# Patient Record
Sex: Male | Born: 1960 | Race: White | Hispanic: No | Marital: Married | Smoking: Never smoker
Health system: Southern US, Community
[De-identification: ages and names within clinical notes are randomized; demographics above are authoritative.]

## PROBLEM LIST (undated history)

## (undated) DIAGNOSIS — T7840XA Allergy, unspecified, initial encounter: Secondary | ICD-10-CM

## (undated) DIAGNOSIS — M1990 Unspecified osteoarthritis, unspecified site: Secondary | ICD-10-CM

## (undated) DIAGNOSIS — Z00129 Encounter for routine child health examination without abnormal findings: Secondary | ICD-10-CM

## (undated) HISTORY — DX: Allergy, unspecified, initial encounter: T78.40XA

## (undated) HISTORY — PX: TONSILLECTOMY: SUR1361

## (undated) HISTORY — PX: LIPOMA EXCISION: SHX5283

## (undated) HISTORY — DX: Unspecified osteoarthritis, unspecified site: M19.90

## (undated) HISTORY — PX: VASECTOMY: SHX75

## (undated) HISTORY — PX: PROSTATE BIOPSY: SHX241

## (undated) HISTORY — PX: APPENDECTOMY: SHX54

## (undated) HISTORY — PX: KNEE ARTHROSCOPY: SUR90

---
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
## 2011-05-25 HISTORY — PX: COLONOSCOPY: SHX174

## 2017-05-26 ENCOUNTER — Other Ambulatory Visit: Payer: Self-pay | Admitting: Urology

## 2017-05-26 DIAGNOSIS — R972 Elevated prostate specific antigen [PSA]: Secondary | ICD-10-CM

## 2017-07-01 ENCOUNTER — Other Ambulatory Visit: Payer: Self-pay

## 2018-06-28 ENCOUNTER — Ambulatory Visit (INDEPENDENT_AMBULATORY_CARE_PROVIDER_SITE_OTHER): Payer: Self-pay

## 2018-06-28 ENCOUNTER — Ambulatory Visit (INDEPENDENT_AMBULATORY_CARE_PROVIDER_SITE_OTHER): Payer: 59 | Admitting: Orthopaedic Surgery

## 2018-06-28 ENCOUNTER — Encounter (INDEPENDENT_AMBULATORY_CARE_PROVIDER_SITE_OTHER): Payer: Self-pay | Admitting: Orthopaedic Surgery

## 2018-06-28 ENCOUNTER — Ambulatory Visit (INDEPENDENT_AMBULATORY_CARE_PROVIDER_SITE_OTHER): Payer: 59

## 2018-06-28 VITALS — Ht 67.0 in | Wt 214.0 lb

## 2018-06-28 DIAGNOSIS — M25562 Pain in left knee: Secondary | ICD-10-CM

## 2018-06-28 DIAGNOSIS — M25561 Pain in right knee: Secondary | ICD-10-CM | POA: Diagnosis not present

## 2018-06-28 DIAGNOSIS — M170 Bilateral primary osteoarthritis of knee: Secondary | ICD-10-CM | POA: Diagnosis not present

## 2018-06-28 DIAGNOSIS — G8929 Other chronic pain: Secondary | ICD-10-CM | POA: Diagnosis not present

## 2018-06-28 DIAGNOSIS — M1711 Unilateral primary osteoarthritis, right knee: Secondary | ICD-10-CM | POA: Insufficient documentation

## 2018-06-28 DIAGNOSIS — M1712 Unilateral primary osteoarthritis, left knee: Secondary | ICD-10-CM | POA: Insufficient documentation

## 2018-06-28 NOTE — Progress Notes (Signed)
Office Visit Note   Patient: Bill Lee           Date of Birth: 1960-12-29           MRN: [id number] Visit Date: 06/28/2018              Requested by: No referring provider defined for this encounter. PCP: Fanny Bien, MD   Assessment & Plan: Visit Diagnoses:  1. Chronic pain of left knee   2. Right knee pain, unspecified chronicity   3. Unilateral primary osteoarthritis, left knee   4. Unilateral primary osteoarthritis, right knee     Plan: I went over his knee x-rays with him in significant detail.  We talked about the risk and benefits of knee replacement surgery.  That would be read my recommendation at this standpoint given the severity of his tricompartmental arthritis involving both knees.  I went over knee replacement models and explained about what his intraoperative and postoperative course would involve talking about this in significant detail.  All question concerns were answered and addressed.  He is going to continue to work on his mobility and strengthening and exercising and stretching his knees.  He will avoid high impact aerobic activities.  I gave him our surgery schedulers card to let us know when he would like to have the schedule.  I would be fine with doing both knees at once or having staged surgery.  Follow-Up Instructions: Return if symptoms worsen or fail to improve.   Orders:  Orders Placed This Encounter  Procedures  . XR Knee 1-2 Views Left  . XR Knee 1-2 Views Right   No orders of the defined types were placed in this encounter.     Procedures: No procedures performed   Clinical Data: No additional findings.   Subjective: Chief Complaint  Patient presents with  . Left Knee - Pain  . Right Knee - Pain  The patient is an incredibly pleasant 58 year old gentleman with bilateral knee pain and swelling for years now.  He has remote history of arthroscopic surgery on the left knee.  Both knees hurt on a daily basis and they have  detrimentally affecting his mobility, his quality of life and his activities of daily living.  He is a very active individual.  This is been going on for again well over 5 years to almost 10 years from when he had his knee arthroscopy on the left knee.  He is not a diabetic.  He is very active.  He has had injections in his knees as well.  He is tried and failed all forms conservative treatment at this point would consider knee replacement surgery.  He does live alone.  He is in a house that he does have 2 steps to get out into a kitchen.  HPI  Review of Systems He currently denies any headache, chest pain, shortness of breath, fever, chills, nausea, vomiting  Objective: Vital Signs: Ht 5\' 7"  (1.702 m)   Wt 214 lb (97.1 kg)   BMI 33.52 kg/m   Physical Exam He is alert and orient x3 and in no acute distress Ortho Exam Examination of both knees show he lacks full extension by just a few degrees.  Both knees have severe patellofemoral crepitation.  Both knees have varus malalignment.  Both knees have a significant pain with flexion extension.  Both knees are ligamentously stable. Specialty Comments:  No specialty comments available.  Imaging: Xr Knee 1-2 Views Left  Result Date: 06/28/2018  2 views the left knee show severe end-stage arthritis.  There is varus malalignment of the knee.  There is complete loss of medial joint space.  There are large particular osteophytes in all 3 compartments.  Xr Knee 1-2 Views Right  Result Date: 06/28/2018 2 views of the right knee show severe end-stage arthritis.  There is complete loss of the medial joint space.  There is varus malalignment.  There is large para-articular osteophytes in all 3 compartments.    PMFS History: Patient Active Problem List   Diagnosis Date Noted  . Unilateral primary osteoarthritis, left knee 06/28/2018  . Unilateral primary osteoarthritis, right knee 06/28/2018   History reviewed. No pertinent past medical history.    History reviewed. No pertinent family history.  History reviewed. No pertinent surgical history. Social History   Occupational History  . Not on file  Tobacco Use  . Smoking status: Not on file  Substance and Sexual Activity  . Alcohol use: Not on file  . Drug use: Not on file  . Sexual activity: Not on file

## 2020-01-30 ENCOUNTER — Ambulatory Visit: Payer: 59

## 2020-02-06 ENCOUNTER — Other Ambulatory Visit: Payer: Self-pay | Admitting: Urology

## 2020-02-06 DIAGNOSIS — R972 Elevated prostate specific antigen [PSA]: Secondary | ICD-10-CM

## 2020-02-27 ENCOUNTER — Other Ambulatory Visit: Payer: Self-pay

## 2020-02-27 ENCOUNTER — Ambulatory Visit: Payer: 59 | Attending: Family Medicine

## 2020-02-27 DIAGNOSIS — G8929 Other chronic pain: Secondary | ICD-10-CM | POA: Diagnosis present

## 2020-02-27 DIAGNOSIS — M25561 Pain in right knee: Secondary | ICD-10-CM | POA: Diagnosis present

## 2020-02-27 DIAGNOSIS — M6281 Muscle weakness (generalized): Secondary | ICD-10-CM

## 2020-02-27 DIAGNOSIS — M25562 Pain in left knee: Secondary | ICD-10-CM | POA: Insufficient documentation

## 2020-02-27 NOTE — Therapy (Signed)
[facility] [address] [place], [place], [address] Phone: [phone]   Fax:  [phone]  Physical Therapy Evaluation  Patient Details  Name: Bill Lee MRN: [id number] Date of Birth: [birth year] Referring Provider (PT): Rachell Cipro, MD   Encounter Date: 02/27/2020   PT End of Session - 02/27/20 1020    Visit Number 1    Date for PT Re-Evaluation 04/23/20    Authorization Type [employer]    PT Start Time 0932    PT Stop Time 1019    PT Time Calculation (min) 47 min    Activity Tolerance Patient tolerated treatment well    Behavior During Therapy [facility] for tasks assessed/performed           History reviewed. No pertinent past medical history.  History reviewed. No pertinent surgical history.  There were no vitals filed for this visit.    Subjective Assessment - 02/27/20 0934    Subjective Pt presents to PT with bil knee immobility and reduced function due to OA.  Pt has been told he needs TKA yet he is not having much pain.  Pt has done PT in the past and this has helped tremendously.  Pt has also had lubricating injections that have helped.  Pt had cortizone injections before a recent trip to [place].    Pertinent History bil knee OA    Limitations Standing;Walking    How long can you stand comfortably? 30 minutes    How long can you walk comfortably? unlevel ground: <1 mile, flat ground: 1 mile    Diagnostic tests x-ray: bil knee OA    Patient Stated Goals learn exercises to improve knee strength and mobility    Currently in Pain? Yes    Pain Score 0-No pain   3/10 with activity   Pain Location Knee    Pain Orientation Right;Left    Pain Descriptors / Indicators Discomfort    Pain Type Chronic pain    Pain Onset More than a month ago    Pain Frequency Intermittent    Aggravating Factors  standing/walking    Pain Relieving Factors sit, reduced activity, ice, stretch              [facility] PT  Assessment - 02/27/20 0001      Assessment   Medical Diagnosis OA knee pain, gait and strengthening    Referring Provider (PT) Rachell Cipro, MD    Onset Date/Surgical Date 02/27/08   Lt knee meniscus repair   Next MD Visit Noveber 2021    Prior Therapy 2010      Precautions   Precautions None      Restrictions   Weight Bearing Restrictions No      Balance Screen   Has the patient fallen in the past 6 months No    Has the patient had a decrease in activity level because of a fear of falling?  No    Is the patient reluctant to leave their home because of a fear of falling?  No      Home Environment   Living Environment Private residence    Living Arrangements Spouse/significant other    Type of [place] to enter    Home Layout Two level      Prior Function   Level of Independence Independent    Vocation Full time employment    Vocation Requirements desk work    Leisure cycling, walking  Cognition   Overall Cognitive Status Within Functional Limits for tasks assessed      Observation/Other Assessments   Focus on Therapeutic Outcomes (FOTO)  48% limitation      Posture/Postural Control   Posture/Postural Control Postural limitations    Posture Comments genu varus bil knees       ROM / Strength   AROM / PROM / Strength AROM;PROM;Strength      AROM   Overall AROM  Within functional limits for tasks performed    Overall AROM Comments full knee A/ROM, hamstring length reduced by 25%, Hip IR limited by 25%      PROM   Overall PROM  Deficits    Overall PROM Comments see above hip P/ROM      Strength   Overall Strength Deficits    Strength Assessment Site Knee;Hip    Right/Left Hip Right;Left    Right Hip Flexion 4+/5    Right Hip Extension 4+/5    Right Hip External Rotation  4/5    Right Hip ABduction 4+/5    Left Hip Flexion 4+/5    Left Hip Extension 4+/5    Left Hip External Rotation 4/5    Left Hip ABduction 4/5     Right/Left Knee Right;Left    Right Knee Flexion 5/5    Right Knee Extension 5/5    Left Knee Flexion 5/5    Left Knee Extension 4+/5      Palpation   Palpation comment no palpable tenderness       Transfers   Transfers Sit to Stand;Stand to Sit    Sit to Stand 7: Independent    Comments sit to stand with mild hip adduction      Ambulation/Gait   Ambulation/Gait Yes    Gait Pattern Step-through pattern;Lateral hip instability    Stairs Yes    Stairs Assistance 6: Modified independent (Device/Increase time)    Stair Management Technique Two rails;Alternating pattern    Gait Comments poor hip stability with descending steps Rt and Lt                       Objective measurements completed on examination: See above findings.               PT Education - 02/27/20 1015    Education Details Access Code: [email or web address]    Person(s) Educated Patient    Methods Explanation;Demonstration;Handout    Comprehension Verbalized understanding;Returned demonstration            PT Short Term Goals - 02/27/20 0933      PT SHORT TERM GOAL #1   Title be independent in initial HEP    Time 4    Period Weeks    Status New    Target Date 03/26/20      PT SHORT TERM GOAL #2   Title report 30% less bil knee  pain and fatigue with standing and walking    Time 4    Period Weeks    Status New    Target Date 03/26/20             PT Long Term Goals - 02/27/20 0933      PT LONG TERM GOAL #1   Title be indepedent in advanced HEP    Time 8    Period Weeks    Status New    Target Date 04/23/20      PT LONG TERM GOAL #2   Title reduce FOTO  to < or = to 33% limitation    Time 8    Period Weeks    Status New    Target Date 04/23/20      PT LONG TERM GOAL #3   Title demonstrate 5/5 bil hip abduction to improve hip stability with negotiating steps and gait    Time 8    Period Weeks    Status New    Target Date 04/23/20      PT LONG TERM GOAL #4   Title report  a 75% reduction in bil knee pain and fatigue with standing and walking    Time 8    Period Weeks    Status New    Target Date 04/23/20      PT LONG TERM GOAL #5   Title improve LE stability to descend steps with use of 1 rail and good eccentric control    Time 8    Period Weeks    Status New    Target Date 04/23/20                  Plan - 02/27/20 1028    Clinical Impression Statement Pt presents to PT with bil knee pain and OA of a chronic nature.  Pt had Lt menesectomy surgery in 2010 and has had problems since. Pt has had lubricating injections in the past and had a recent cortisone injection into bil knees recently before traveling to [place].  Pt reports 3/10 "discomfort" in bil knees with standing > 30 minutes and walking  to 1 mile and has associated LE fatigue with this.  Pt cycles regularly and does not have any limitation or pain associated with this.  Pt demonstrates genu varus bilaterally.  Pt demonstrates lateral hip instability with descending steps with reduced control on Rt and Lt.  Pt requires moderate UE support and demonstrates poor eccentric control.  Pt with decreased Rt and Lt hip abduction and strength.  Pt will benefit from skilled PT to address functional hip and knee instability, improve endurance and strength for functional tasks.    Personal Factors and Comorbidities Comorbidity 1    Comorbidities bil knee OA    Examination-Activity Limitations Stairs;Squat;Locomotion Level    Stability/Clinical Decision Making Stable/Uncomplicated    Clinical Decision Making Low    Rehab Potential Excellent    PT Frequency 1x / week    PT Duration 8 weeks    PT Treatment/Interventions ADLs/Self Care Home Management;Cryotherapy;Electrical Stimulation;Moist Heat;Gait training;Stair training;Functional mobility training;Therapeutic activities;Therapeutic exercise;Neuromuscular re-education;Manual techniques;Patient/family education;Passive range of motion;Taping    PT Next  Visit Plan Review HEP, add core, hip, knee and stability exercises    PT Home Exercise Plan Access Code: [email or web address]    Consulted and Agree with Plan of Care Patient           Patient will benefit from skilled therapeutic intervention in order to improve the following deficits and impairments:  Abnormal gait, Decreased activity tolerance, Decreased strength, Postural dysfunction, Decreased endurance, Decreased range of motion, Pain  Visit Diagnosis: Chronic pain of left knee - Plan: PT plan of care cert/re-cert  Chronic pain of right knee - Plan: PT plan of care cert/re-cert  Muscle weakness (generalized) - Plan: PT plan of care cert/re-cert     Problem List Patient Active Problem List   Diagnosis Date Noted  . Unilateral primary osteoarthritis, left knee 06/28/2018  . Unilateral primary osteoarthritis, right knee 06/28/2018    Sigurd Sos, PT 02/27/20 10:37 AM  [facility] [address] [place], [place], [address] Phone: [phone]   Fax:  [phone]  Name: Bill Lee MRN: [id number] Date of Birth: 04-04-1961

## 2020-02-27 NOTE — Patient Instructions (Signed)
Access Code: [email or web address] URL: [email or web address] Date: 02/27/2020 Prepared by: Claiborne Billings  Exercises Forward Step Down Touch with Heel - 2 x daily - 7 x weekly - 2 sets - 10 reps Clamshell - 2 x daily - 7 x weekly - 2 sets - 10 reps Supine Bridge with Resistance Band - 2 x daily - 7 x weekly - 2 sets - 10 reps - 5 hold Sit to Stand without Arm Support - 2 x daily - 7 x weekly - 2 sets - 10 reps

## 2020-03-03 ENCOUNTER — Other Ambulatory Visit: Payer: 59

## 2020-03-05 ENCOUNTER — Ambulatory Visit: Payer: 59

## 2020-03-05 ENCOUNTER — Other Ambulatory Visit: Payer: Self-pay

## 2020-03-05 DIAGNOSIS — M25561 Pain in right knee: Secondary | ICD-10-CM

## 2020-03-05 DIAGNOSIS — M25562 Pain in left knee: Secondary | ICD-10-CM | POA: Diagnosis not present

## 2020-03-05 DIAGNOSIS — M6281 Muscle weakness (generalized): Secondary | ICD-10-CM

## 2020-03-05 DIAGNOSIS — G8929 Other chronic pain: Secondary | ICD-10-CM

## 2020-03-05 NOTE — Therapy (Signed)
[facility] [address] [place], [place], [address] Phone: [phone]   Fax:  [phone]  Physical Therapy Treatment  Patient Details  Name: Bill Lee MRN: [id number] Date of Birth: 1961/03/04 Referring Provider (PT): Rachell Cipro, MD   Encounter Date: 03/05/2020   PT End of Session - 03/05/20 1013    Visit Number 2    Date for PT Re-Evaluation 04/23/20    Authorization Type Aet[employer]    PT Start Time [phone]    PT Stop Time 1013    PT Time Calculation (min) 39 min    Activity Tolerance Patient tolerated treatment well    Behavior During Therapy [facility] for tasks assessed/performed           History reviewed. No pertinent past medical history.  History reviewed. No pertinent surgical history.  There were no vitals filed for this visit.   Subjective Assessment - 03/05/20 0936    Subjective I have been doing my exercises about 50% of  the time.    Currently in Pain? Yes    Pain Score 0-No pain                             [facility] Adult PT Treatment/Exercise - 03/05/20 0001      Exercises   Exercises Knee/Hip;Lumbar      Lumbar Exercises: Seated   Sit to Stand 20 reps      Knee/Hip Exercises: Stretches   Active Hamstring Stretch Both;2 reps;30 seconds      Knee/Hip Exercises: Standing   Step Down Both;2 sets;5 sets;Step Height: 2"    Step Down Limitations grinding and reduced eccentric control.  Modified to SLS    SLS 10" hold x x5- cueing for level pelvis      Knee/Hip Exercises: Seated   Sit to Sand 2 sets;10 reps;without UE support      Knee/Hip Exercises: Sidelying   Hip ABduction Strengthening;Both;2 sets;10 reps    Hip ABduction Limitations max verbal cueing and tactile cues for alignment to reduce hip flexor substitution    Clams 2x10 bil                  PT Education - 03/05/20 1004    Education Details Access Code: [email or web address]    Person(s) Educated Patient     Methods Explanation;Demonstration;Handout    Comprehension Verbalized understanding;Returned demonstration            PT Short Term Goals - 02/27/20 0933      PT SHORT TERM GOAL #1   Title be independent in initial HEP    Time 4    Period Weeks    Status New    Target Date 03/26/20      PT SHORT TERM GOAL #2   Title report 30% less bil knee  pain and fatigue with standing and walking    Time 4    Period Weeks    Status New    Target Date 03/26/20             PT Long Term Goals - 02/27/20 0933      PT LONG TERM GOAL #1   Title be indepedent in advanced HEP    Time 8    Period Weeks    Status New    Target Date 04/23/20      PT LONG TERM GOAL #2   Title reduce FOTO to < or = to  33% limitation    Time 8    Period Weeks    Status New    Target Date 04/23/20      PT LONG TERM GOAL #3   Title demonstrate 5/5 bil hip abduction to improve hip stability with negotiating steps and gait    Time 8    Period Weeks    Status New    Target Date 04/23/20      PT LONG TERM GOAL #4   Title report a 75% reduction in bil knee pain and fatigue with standing and walking    Time 8    Period Weeks    Status New    Target Date 04/23/20      PT LONG TERM GOAL #5   Title improve LE stability to descend steps with use of 1 rail and good eccentric control    Time 8    Period Weeks    Status New    Target Date 04/23/20                 Plan - 03/05/20 0957    Clinical Impression Statement Pt with first time follow-up after evaluation.  Pt reports 50% compliance with HEP.  Pt provided verbal and tactile cues for gluteal activation and alignment with standing exercises.  PT reduced step height to 2" for step-down.  Pt had uncomfortable "grinding" from knees and PT further modified this to single leg stance with emphasis on soft knee and level pelvis.  PT provided consistent verbal and tactile cueing for core and gluteal activation.  Pt with chronic knee OA bilaterally and  will benefit from skilled PT for strength, flexibility and endurance.    PT Frequency 1x / week    PT Duration 8 weeks    PT Treatment/Interventions ADLs/Self Care Home Management;Cryotherapy;Electrical Stimulation;Moist Heat;Gait training;Stair training;Functional mobility training;Therapeutic activities;Therapeutic exercise;Neuromuscular re-education;Manual techniques;Patient/family education;Passive range of motion;Taping    PT Next Visit Plan add to HEP if pt is consistent with current HEP.  Core, hip, knee strength and stability.    PT Home Exercise Plan Access Code: [email or web address]    Recommended Other Services initial cert is signed    Consulted and Agree with Plan of Care Patient           Patient will benefit from skilled therapeutic intervention in order to improve the following deficits and impairments:  Abnormal gait, Decreased activity tolerance, Decreased strength, Postural dysfunction, Decreased endurance, Decreased range of motion, Pain  Visit Diagnosis: Chronic pain of left knee  Chronic pain of right knee  Muscle weakness (generalized)     Problem List Patient Active Problem List   Diagnosis Date Noted  . Unilateral primary osteoarthritis, left knee 06/28/2018  . Unilateral primary osteoarthritis, right knee 06/28/2018     Sigurd Sos, PT 03/05/20 10:14 AM  [facility] [address] [place], [place], [address] Phone: [phone]   Fax:  [phone]  Name: Matthias Bogus MRN: [id number] Date of Birth: 06/27/1960

## 2020-03-05 NOTE — Patient Instructions (Signed)
Access Code: [email or web address] URL: [email or web address] Date: 03/05/2020 Prepared by: Claiborne Billings  Exercises Single Leg Stance - 2 x daily - 7 x weekly - 1 sets - 5 reps - 10 hold

## 2020-03-07 ENCOUNTER — Other Ambulatory Visit: Payer: Self-pay

## 2020-03-12 ENCOUNTER — Other Ambulatory Visit: Payer: Self-pay

## 2020-03-12 ENCOUNTER — Encounter: Payer: Self-pay | Admitting: Physical Therapy

## 2020-03-12 ENCOUNTER — Ambulatory Visit (INDEPENDENT_AMBULATORY_CARE_PROVIDER_SITE_OTHER): Payer: 59 | Admitting: Gastroenterology

## 2020-03-12 ENCOUNTER — Ambulatory Visit: Payer: 59 | Admitting: Physical Therapy

## 2020-03-12 ENCOUNTER — Encounter: Payer: Self-pay | Admitting: Gastroenterology

## 2020-03-12 VITALS — BP 118/70 | HR 91 | Ht 67.0 in | Wt 210.0 lb

## 2020-03-12 DIAGNOSIS — K625 Hemorrhage of anus and rectum: Secondary | ICD-10-CM | POA: Diagnosis not present

## 2020-03-12 DIAGNOSIS — R1310 Dysphagia, unspecified: Secondary | ICD-10-CM | POA: Diagnosis not present

## 2020-03-12 DIAGNOSIS — G8929 Other chronic pain: Secondary | ICD-10-CM

## 2020-03-12 DIAGNOSIS — M6281 Muscle weakness (generalized): Secondary | ICD-10-CM

## 2020-03-12 DIAGNOSIS — M25561 Pain in right knee: Secondary | ICD-10-CM

## 2020-03-12 DIAGNOSIS — M25562 Pain in left knee: Secondary | ICD-10-CM | POA: Diagnosis not present

## 2020-03-12 MED ORDER — PLENVU 140 G PO SOLR: 140.0000 g | ORAL | 0 refills | Status: DC

## 2020-03-12 NOTE — Progress Notes (Signed)
Referring Provider: Fanny Bien, MD Primary Care Physician:  Fanny Bien, MD  Reason for Consultation:  Dysphagia, rectal bleeding   IMPRESSION:  Dysphagia to solids Rare Meloxicam use for arthritis Intermittent rectal bleeding Prior colonoscopy with Dr. Maurene Capes [facility]) 2013  Intermittent solid food dysphagia: Etiology unclear.  No alarm symptoms.  By history, sounds esophageal. Trial of pantoprazole 40 mg QAM recommended but the patient declined. EGD with esophageal and gastric biopsies recommended. Will readdress therapeutic options after endoscopy.   Intermittent rectal bleeding: The differential for rectal bleeding is broad.  It includes outlet sources such as fissure or hemorrhoids, as well as polyps, mass, ulcers, and colitis.  Given this differential I am recommending a colonoscopy.  PLAN: EGD High fiber diet recommended, drink at least 1.5-2 liters of water Daily stool bulking agent with psyllium or methylcellulose recommended Colonoscopy Obtain prior colonoscopy reports from [facility]  Please see the "Patient Instructions" section for addition details about the plan.  HPI: Bill Lee is a 59 y.o. male referred by Dr. Ernie Hew for dysphagia.  The history is obtained through the patient.  He has arthritis managed with meloxicam and physical therapy.  Seen today for intermittent dysphagia to solids for 2 years. Localized to the sternal notch. Sensation that food is stuck, particularly when eating dry foods. Improved if he tucks his chin when he swallows.   Infrequent nausea or vomiting.  No heartburn, odynophagia, dysphonia, cough, neck pain, sore throat, abdominal pain, change in bowel habits. Dr. Ernie Hew suggested that it was reflux, but, because he doesn't have heartburn so he wasn't sure this was the etiology. His wife didn't want him to start heartburn medicine that he may have to be on the indefinately. He would prefer a full evaluation and an answer as opposed to  empiric therapy.   Reports intermittent rectal bleeding on the toilet paper without constipation, diarrhea, change in bowel habits, or straining. No rectal pain.  Uses Metamucil daily with control of daily bowel habits.  Has at least 1 formed bowel movement daily.  Uses meloxicam for joint pains.   No prior abdominal imaging.  Had a colonoscopy in 2013 with Dr. Maurene Capes in [place], [place].  No known family history of colon cancer or polyps. No family history of uterine/endometrial cancer, pancreatic cancer or gastric/stomach cancer.   Past Medical History:  Diagnosis Date  . Healthy male adolescent     Past Surgical History:  Procedure Laterality Date  . APPENDECTOMY    . COLONOSCOPY  2013  . KNEE ARTHROSCOPY Left   . LIPOMA EXCISION    . PROSTATE BIOPSY    . TONSILLECTOMY    . VASECTOMY      Current Outpatient Medications  Medication Sig Dispense Refill  . DULoxetine (CYMBALTA) 20 MG capsule Take 20 mg by mouth daily.    . meloxicam (MOBIC) 7.5 MG tablet Take 7.5 mg by mouth as needed.     . psyllium (REGULOID) 0.52 g capsule Take 0.52 g by mouth daily. Takes 1-2 tablets qd but may take up to 4 tablets qd    . PEG-KCl-NaCl-NaSulf-Na Asc-C (PLENVU) 140 g SOLR Take 140 g by mouth as directed. Manufacturer's coupon Universal coupon code:BIN: P2366821; GROUP: [id number]; PCN: [id number]; ID: [id number]; PAY NO MORE $50 1 each 0   No current facility-administered medications for this visit.    Allergies as of 03/12/2020 - Review Complete 03/12/2020  Allergen Reaction Noted  . Penicillins  06/28/2018    Family History  Problem  Relation Age of Onset  . Stroke Mother   . Alcoholism Father   . Healthy Sister   . Dysphagia Maternal Grandmother   . Other Paternal Grandfather        pacemaker  . Colon cancer Neg Hx   . Stomach cancer Neg Hx   . Liver disease Neg Hx   . Esophageal cancer Neg Hx   . Pancreatic cancer Neg Hx     Social History   Socioeconomic History  . Marital status:  Single    Spouse name: Not on file  . Number of children: Not on file  . Years of education: Not on file  . Highest education level: Not on file  Occupational History  . Not on file  Tobacco Use  . Smoking status: Never Smoker  . Smokeless tobacco: Never Used  Vaping Use  . Vaping Use: Never used  Substance and Sexual Activity  . Alcohol use: Yes    Alcohol/week: 2.0 - 3.0 standard drinks    Types: 2 - 3 Cans of beer per week  . Drug use: Never  . Sexual activity: Yes    Partners: Female    Birth control/protection: None    Comment: married  Other Topics Concern  . Not on file  Social History Narrative  . Not on file   Social Determinants of Health   Financial Resource Strain:   . Difficulty of Paying Living Expenses: Not on file  Food Insecurity:   . Worried About Charity fundraiser in the Last Year: Not on file  . Ran Out of Food in the Last Year: Not on file  Transportation Needs:   . Lack of Transportation (Medical): Not on file  . Lack of Transportation (Non-Medical): Not on file  Physical Activity:   . Days of Exercise per Week: Not on file  . Minutes of Exercise per Session: Not on file  Stress:   . Feeling of Stress : Not on file  Social Connections:   . Frequency of Communication with Friends and Family: Not on file  . Frequency of Social Gatherings with Friends and Family: Not on file  . Attends Religious Services: Not on file  . Active Member of Clubs or Organizations: Not on file  . Attends Archivist Meetings: Not on file  . Marital Status: Not on file  Intimate Partner Violence:   . Fear of Current or Ex-Partner: Not on file  . Emotionally Abused: Not on file  . Physically Abused: Not on file  . Sexually Abused: Not on file    Review of Systems: 12 system ROS is negative except as noted above with the addition of arthritis, back pain, confusion.   Physical Exam: General:   Alert,  well-nourished, pleasant and cooperative in  NAD Head:  Normocephalic and atraumatic. Eyes:  Sclera clear, no icterus.   Conjunctiva pink. Ears:  Normal auditory acuity. Nose:  No deformity, discharge,  or lesions. Mouth:  No deformity or lesions.   Neck:  Supple; no masses or thyromegaly. Lungs:  Clear throughout to auscultation.   No wheezes. Heart:  Regular rate and rhythm; no murmurs. Abdomen:  Soft, nontender, nondistended, normal bowel sounds, no rebound or guarding. No hepatosplenomegaly.   Rectal:  Deferred  Msk:  Symmetrical. No boney deformities LAD: No inguinal or umbilical LAD Extremities:  No clubbing or edema. Neurologic:  Alert and  oriented x4;  grossly nonfocal Skin:  Intact without significant lesions or rashes. Psych:  Alert and  cooperative. Normal mood and affect.    Anadelia Kintz L. Tarri Glenn, MD, MPH 03/12/2020, 9:52 AM

## 2020-03-12 NOTE — Patient Instructions (Addendum)
I have recommended an EGD and a colonoscopy.   Tips for colonoscopy:  - Stay well hydrated for 3-4 days prior to the exam. This reduces nausea and dehydration.  - To prevent skin/hemorrhoid irritation - prior to wiping, put A&Dointment or vaseline on the toilet paper. - Keep a towel or pad on the bed.  - Drink  64oz of clear liquids in the morning of prep day (prior to starting the prep) to be sure that there is enough fluid to flush the colon and stay hydrated!!!! This is in addition to the fluids required for preparation. - Use of a flavored hard candy, such as grape Anise Salvo, can counteract some of the flavor of the prep and may prevent some nausea.   If you are age 33 or older, your body mass index should be between 23-30. Your Body mass index is 32.89 kg/m. If this is out of the aforementioned range listed, please consider follow up with your Primary Care Provider.  If you are age 72 or younger, your body mass index should be between 19-25. Your Body mass index is 32.89 kg/m. If this is out of the aformentioned range listed, please consider follow up with your Primary Care Provider.   You have been scheduled for an endoscopy and colonoscopy. Please follow the written instructions given to you at your visit today. Please pick up your prep supplies at the pharmacy within the next 1-3 days. If you use inhalers (even only as needed), please bring them with you on the day of your procedure.  Take a daily stool bulking agent with psyllium or methylcellulose  Thank you for trusting me with your gastrointestinal care!    Thornton Park, MD, MPH

## 2020-03-12 NOTE — Therapy (Signed)
[facility] [address] [place], [place], [address] Phone: [phone]   Fax:  [phone]  Physical Therapy Treatment  Patient Details  Name: Bill Lee MRN: [id number] Date of Birth: 08-14-1960 Referring Provider (PT): Rachell Cipro, MD   Encounter Date: 03/12/2020   PT End of Session - 03/12/20 1229    Visit Number 3    Date for PT Re-Evaluation 04/23/20    Authorization Type Aetna    PT Start Time 1224    PT Stop Time 1310    PT Time Calculation (min) 46 min    Activity Tolerance Patient tolerated treatment well    Behavior During Therapy [facility] for tasks assessed/performed           Past Medical History:  Diagnosis Date  . Healthy male adolescent     Past Surgical History:  Procedure Laterality Date  . APPENDECTOMY    . COLONOSCOPY  2013  . KNEE ARTHROSCOPY Left   . LIPOMA EXCISION    . PROSTATE BIOPSY    . TONSILLECTOMY    . VASECTOMY      There were no vitals filed for this visit.   Subjective Assessment - 03/12/20 1230    Subjective Doing well. Riding my bike and walking on my own. i don't always get to my hip exercises.    Pertinent History bil knee OA    Currently in Pain? No/denies    Multiple Pain Sites No                             [facility] Adult PT Treatment/Exercise - 03/12/20 0001      Knee/Hip Exercises: Aerobic   Stationary Bike L2-3 x 10 min      Knee/Hip Exercises: Machines for Strengthening   Total Gym Leg Press Seat 6: Bil 120# 10x 2 : RTLE 60# 10x      Knee/Hip Exercises: Standing   SLS 10" hold x x5- cueing for level pelvis   VC to unlock knee,    Other Standing Knee Exercises Woodpecker exercise 10x Bil: PTA demo instruction, pt good demo.       Knee/Hip Exercises: Sidelying   Hip ABduction Strengthening;Both;1 set;10 reps    Hip ABduction Limitations TC to get into more hip extension   yellow band added for second set: will do at home    Clams 2x10 bil                    PT Short Term Goals - 02/27/20 0933      PT SHORT TERM GOAL #1   Title be independent in initial HEP    Time 4    Period Weeks    Status New    Target Date 03/26/20      PT SHORT TERM GOAL #2   Title report 30% less bil knee  pain and fatigue with standing and walking    Time 4    Period Weeks    Status New    Target Date 03/26/20             PT Long Term Goals - 02/27/20 0933      PT LONG TERM GOAL #1   Title be indepedent in advanced HEP    Time 8    Period Weeks    Status New    Target Date 04/23/20      PT LONG TERM GOAL #2  Title reduce FOTO to < or = to 33% limitation    Time 8    Period Weeks    Status New    Target Date 04/23/20      PT LONG TERM GOAL #3   Title demonstrate 5/5 bil hip abduction to improve hip stability with negotiating steps and gait    Time 8    Period Weeks    Status New    Target Date 04/23/20      PT LONG TERM GOAL #4   Title report a 75% reduction in bil knee pain and fatigue with standing and walking    Time 8    Period Weeks    Status New    Target Date 04/23/20      PT LONG TERM GOAL #5   Title improve LE stability to descend steps with use of 1 rail and good eccentric control    Time 8    Period Weeks    Status New    Target Date 04/23/20                 Plan - 03/12/20 1229    Clinical Impression Statement Pt compliant with most HEP, he reports he tends to miss his "hip exercises." Pt demonstrates Bil hip instability seemingly > on the LT. Yellow band was given to advance hip clam shells and hip abduction.    Personal Factors and Comorbidities Comorbidity 1    Comorbidities bil knee OA    Examination-Activity Limitations Stairs;Squat;Locomotion Level    Stability/Clinical Decision Making Stable/Uncomplicated    Rehab Potential Excellent    PT Frequency 1x / week    PT Duration 8 weeks    PT Treatment/Interventions ADLs/Self Care Home  Management;Cryotherapy;Electrical Stimulation;Moist Heat;Gait training;Stair training;Functional mobility training;Therapeutic activities;Therapeutic exercise;Neuromuscular re-education;Manual techniques;Patient/family education;Passive range of motion;Taping    PT Next Visit Plan add to HEP if pt is consistent with current HEP.  Core, hip, knee strength and stability.    PT Home Exercise Plan Access Code: [email or web address]    Consulted and Agree with Plan of Care Patient           Patient will benefit from skilled therapeutic intervention in order to improve the following deficits and impairments:  Abnormal gait, Decreased activity tolerance, Decreased strength, Postural dysfunction, Decreased endurance, Decreased range of motion, Pain  Visit Diagnosis: Chronic pain of left knee  Chronic pain of right knee  Muscle weakness (generalized)     Problem List Patient Active Problem List   Diagnosis Date Noted  . Unilateral primary osteoarthritis, left knee 06/28/2018  . Unilateral primary osteoarthritis, right knee 06/28/2018    Bill Lee, PTA 03/12/2020, 1:13 PM  [facility] [address], [place], [place], [address] Phone: [phone]   Fax:  [phone]  Name: Bill Lee MRN: [id number] Date of Birth: 04/20/1961

## 2020-03-19 ENCOUNTER — Encounter: Payer: Self-pay | Admitting: Physical Therapy

## 2020-03-19 ENCOUNTER — Ambulatory Visit: Payer: 59 | Admitting: Physical Therapy

## 2020-03-19 ENCOUNTER — Other Ambulatory Visit: Payer: Self-pay

## 2020-03-19 DIAGNOSIS — M6281 Muscle weakness (generalized): Secondary | ICD-10-CM

## 2020-03-19 DIAGNOSIS — M25562 Pain in left knee: Secondary | ICD-10-CM

## 2020-03-19 DIAGNOSIS — G8929 Other chronic pain: Secondary | ICD-10-CM

## 2020-03-19 NOTE — Therapy (Addendum)
[facility] [address] [place], [place], [address] Phone: [phone]   Fax:  [phone]  Physical Therapy Treatment  Patient Details  Name: Bill Lee MRN: [id number] Date of Birth: Dec 16, 1960 Referring Provider (PT): Maryelizabeth Rowan, MD   Encounter Date: 03/19/2020   PT End of Session - 03/19/20 0932    Visit Number 4    Date for PT Re-Evaluation 04/23/20    Authorization Type Aetna    PT Start Time 0930    PT Stop Time 1010    PT Time Calculation (min) 40 min    Activity Tolerance Patient tolerated treatment well    Behavior During Therapy [facility] for tasks assessed/performed           History reviewed. No pertinent past medical history.  Past Surgical History:  Procedure Laterality Date  . APPENDECTOMY    . COLONOSCOPY  2013  . KNEE ARTHROSCOPY Left   . LIPOMA EXCISION    . PROSTATE BIOPSY    . TONSILLECTOMY    . VASECTOMY      There were no vitals filed for this visit.   Subjective Assessment - 03/19/20 0934    Subjective Man my hips are weak.    Pertinent History bil knee OA    Currently in Pain? No/denies    Multiple Pain Sites No              [facility] PT Assessment - 03/19/20 0001      Observation/Other Assessments   Focus on Therapeutic Outcomes (FOTO)  46% limitation      Strength   Strength Assessment Site Hip;Knee    Right/Left Hip Right;Left    Right Hip Flexion 4+/5    Right Hip Extension 4+/5    Right Hip ABduction 4+/5    Left Hip Flexion 4+/5    Left Hip Extension 4+/5    Left Hip ABduction 4+/5    Right Knee Flexion 5/5    Right Knee Extension 5/5    Left Knee Flexion 5/5    Left Knee Extension 5/5                         [facility] Adult PT Treatment/Exercise - 03/19/20 0001      Knee/Hip Exercises: Aerobic   Stationary Bike L4 x 10 min to end session Pt can maintain > 50 rpms       Knee/Hip Exercises: Machines for Strengthening   Total Gym Leg  Press Seat 6 Bil 120# 10x, 150# 10x,  BilLE 60# 10x, 70# 10x 80# on Lt for second set      Knee/Hip Exercises: Standing   Other Standing Knee Exercises Woodpecker exercise 10x Bil: PTA demo instruction, pt good demo.    VC to facilitate later hip >     Knee/Hip Exercises: Sidelying   Other Sidelying Knee/Hip Exercises PTA check in with  activation of  glute medius'; pt could demo with good activation                     PT Short Term Goals - 02/27/20 0933      PT SHORT TERM GOAL #1   Title be independent in initial HEP    Time 4    Period Weeks    Status New    Target Date 03/26/20      PT SHORT TERM GOAL #2   Title report 30% less bil knee  pain and fatigue  with standing and walking    Time 4    Period Weeks    Status New    Target Date 03/26/20             PT Long Term Goals - 03/19/20 1008      PT LONG TERM GOAL #1   Title be indepedent in advanced HEP    Time 8    Period Weeks    Status Achieved      PT LONG TERM GOAL #2   Title reduce FOTO to < or = to 33% limitation    Time 8    Period Weeks    Status Not Met   46%     PT LONG TERM GOAL #3   Title demonstrate 5/5 bil hip abduction to improve hip stability with negotiating steps and gait    Time 8    Period Weeks    Status Not Met   Stil 4+/5     PT LONG TERM GOAL #4   Title report a 75% reduction in bil knee pain and fatigue with standing and walking    Time 8    Period Weeks    Status Achieved      PT LONG TERM GOAL #5   Title improve LE stability to descend steps with use of 1 rail and good eccentric control    Time 8    Period Weeks    Status Not Met                 Plan - 03/19/20 0932    Clinical Impression Statement Pt arrives with pain free knee report and continued comploiance with HEP. Pt has worked very hard this past week on facilittaing his glute medius more efficiently which he is. pt tolerated increases in load on the leg press and is beginning to show some  improvemnt in unlockin ghis knee more during standing hip stabiliazation exercises. Hip abductors not "stronger" via MMT but he definitely can activate better, so LTG was not met.    Personal Factors and Comorbidities Comorbidity 1    Comorbidities bil knee OA    Examination-Activity Limitations Stairs;Squat;Locomotion Level    Stability/Clinical Decision Making Stable/Uncomplicated    Rehab Potential Excellent    PT Frequency 1x / week    PT Duration 8 weeks    PT Treatment/Interventions ADLs/Self Care Home Management;Cryotherapy;Electrical Stimulation;Moist Heat;Gait training;Stair training;Functional mobility training;Therapeutic activities;Therapeutic exercise;Neuromuscular re-education;Manual techniques;Patient/family education;Passive range of motion;Taping    PT Next Visit Plan DC to HEP    PT Home Exercise Plan Access Code: [email or web address]    Consulted and Agree with Plan of Care Patient           Patient will benefit from skilled therapeutic intervention in order to improve the following deficits and impairments:  Abnormal gait, Decreased activity tolerance, Decreased strength, Postural dysfunction, Decreased endurance, Decreased range of motion, Pain  Visit Diagnosis: Chronic pain of left knee  Chronic pain of right knee  Muscle weakness (generalized)     Problem List Patient Active Problem List   Diagnosis Date Noted  . Unilateral primary osteoarthritis, left knee 06/28/2018  . Unilateral primary osteoarthritis, right knee 06/28/2018    Myrene Galas, PTA 03/19/20 10:54 AM  03/19/2020, 10:52 AM PHYSICAL THERAPY DISCHARGE SUMMARY  Visits from Start of Care: 4  Current functional level related to goals / functional outcomes: See above for current status.  Pt will continue with HEP.    Remaining deficits: Functional LE weakness.  Pt with improved activation and cocontraction and will continue with HEP to continue to improve strength.     Education /  Equipment: HEP Plan: Patient agrees to discharge.  Patient goals were partially met. Patient is being discharged due to being pleased with the current functional level.  ?????        Sigurd Sos, PT 03/19/20 11:46 AM   [facility] [address], [place], [place], [address] Phone: [phone]   Fax:  [phone]  Name: Bill Lee MRN: [id number] Date of Birth: 11-24-1960

## 2020-03-21 ENCOUNTER — Ambulatory Visit
Admission: RE | Admit: 2020-03-21 | Discharge: 2020-03-21 | Disposition: A | Discharge status: Home / Self Care | Payer: 59 | Source: Ambulatory Visit | Attending: Urology | Admitting: Urology

## 2020-03-21 ENCOUNTER — Other Ambulatory Visit: Payer: Self-pay

## 2020-03-21 DIAGNOSIS — R972 Elevated prostate specific antigen [PSA]: Secondary | ICD-10-CM

## 2020-03-21 MED ORDER — GADOBENATE DIMEGLUMINE 529 MG/ML IV SOLN
20.0000 mL | Freq: Once | INTRAVENOUS | Status: AC | PRN
  Administered 2020-03-21: 20 mL via INTRAVENOUS

## 2020-05-05 ENCOUNTER — Ambulatory Visit (AMBULATORY_SURGERY_CENTER): Payer: 59 | Admitting: Gastroenterology

## 2020-05-05 ENCOUNTER — Other Ambulatory Visit: Payer: Self-pay

## 2020-05-05 ENCOUNTER — Encounter: Payer: Self-pay | Admitting: Gastroenterology

## 2020-05-05 VITALS — BP 103/57 | HR 71 | Temp 96.4°F | Resp 18 | Ht 67.0 in | Wt 210.0 lb

## 2020-05-05 DIAGNOSIS — D120 Benign neoplasm of cecum: Secondary | ICD-10-CM

## 2020-05-05 DIAGNOSIS — K2100 Gastro-esophageal reflux disease with esophagitis, without bleeding: Secondary | ICD-10-CM

## 2020-05-05 DIAGNOSIS — R1310 Dysphagia, unspecified: Secondary | ICD-10-CM | POA: Diagnosis present

## 2020-05-05 DIAGNOSIS — K319 Disease of stomach and duodenum, unspecified: Secondary | ICD-10-CM

## 2020-05-05 DIAGNOSIS — K648 Other hemorrhoids: Secondary | ICD-10-CM

## 2020-05-05 DIAGNOSIS — K3189 Other diseases of stomach and duodenum: Secondary | ICD-10-CM | POA: Diagnosis not present

## 2020-05-05 DIAGNOSIS — K625 Hemorrhage of anus and rectum: Secondary | ICD-10-CM | POA: Diagnosis not present

## 2020-05-05 MED ORDER — SODIUM CHLORIDE 0.9 % IV SOLN
500.0000 mL | Freq: Once | INTRAVENOUS | Status: DC
Start: 2020-05-05 — End: 2020-05-05

## 2020-05-05 MED ORDER — PANTOPRAZOLE SODIUM 40 MG PO TBEC: 40.0000 mg | DELAYED_RELEASE_TABLET | Freq: Two times a day (BID) | ORAL | 2 refills | Status: DC

## 2020-05-05 NOTE — Progress Notes (Signed)
Called to room to assist during endoscopic procedure.  Patient ID and intended procedure confirmed with present staff. Received instructions for my participation in the procedure from the performing physician.  

## 2020-05-05 NOTE — Patient Instructions (Signed)
Handouts on gastritis,esophagitis given to you today  Await pathology results on biopsies done   NO ASPIRIN, ASPIRIN CONTAINING PRODUCTS (BC OR GOODY POWDERS) OR NSAIDS (IBUPROFEN, ADVIL, ALEVE, AND MOTRIN) INCLUDING MELOXICAM .; TYLENOL IS OK TO TAKE  START PANTOPRAZOLE 40 MG BY MOUTH TWICE A DAY FOR 8 WEEKS - SENT TO YOUR PHARMACY   HANDOUTS ON POLYPS AND HEMORRHOIDS & HIGH FIBER DIET  GIVEN TO YOU TODAY  FOLLOW HIGH FIBER DIET   ADD PSYLLIUM OR METHYL CELLULOSE SUCH AS METAMUCIL   AWAIT PATHOLOGY RESULTS ON POLYP REMOVED     YOU HAD AN ENDOSCOPIC PROCEDURE TODAY AT [place]:   Refer to the procedure report that was given to you for any specific questions about what was found during the examination.  If the procedure report does not answer your questions, please call your gastroenterologist to clarify.  If you requested that your care partner not be given the details of your procedure findings, then the procedure report has been included in a sealed envelope for you to review at your convenience later.  YOU SHOULD EXPECT: Some feelings of bloating in the abdomen. Passage of more gas than usual.  Walking can help get rid of the air that was put into your GI tract during the procedure and reduce the bloating. If you had a lower endoscopy (such as a colonoscopy or flexible sigmoidoscopy) you may notice spotting of blood in your stool or on the toilet paper. If you underwent a bowel prep for your procedure, you may not have a normal bowel movement for a few days.  Please Note:  You might notice some irritation and congestion in your nose or some drainage.  This is from the oxygen used during your procedure.  There is no need for concern and it should clear up in a day or so.  SYMPTOMS TO REPORT IMMEDIATELY:   Following lower endoscopy (colonoscopy or flexible sigmoidoscopy):  Excessive amounts of blood in the stool  Significant tenderness or worsening of abdominal  pains  Swelling of the abdomen that is new, acute  Fever of 100F or higher   Following upper endoscopy (EGD)  Vomiting of blood or coffee ground material  New chest pain or pain under the shoulder blades  Painful or persistently difficult swallowing  New shortness of breath  Fever of 100F or higher  Black, tarry-looking stools  For urgent or emergent issues, a gastroenterologist can be reached at any hour by calling [phone]. Do not use MyChart messaging for urgent concerns.    DIET:  We do recommend a small meal at first, but then you may proceed to your regular diet.  Drink plenty of fluids but you should avoid alcoholic beverages for 24 hours.  ACTIVITY:  You should plan to take it easy for the rest of today and you should NOT DRIVE or use heavy machinery until tomorrow (because of the sedation medicines used during the test).    FOLLOW UP: Our staff will call the number listed on your records 48-72 hours following your procedure to check on you and address any questions or concerns that you may have regarding the information given to you following your procedure. If we do not reach you, we will leave a message.  We will attempt to reach you two times.  During this call, we will ask if you have developed any symptoms of COVID 19. If you develop any symptoms (ie: fever, flu-like symptoms, shortness of breath, cough  etc.) before then, please call [phone].  If you test positive for Covid 19 in the 2 weeks post procedure, please call and report this information to [place].    If any biopsies were taken you will be contacted by phone or by letter within the next 1-3 weeks.  Please call us at [phone] if you have not heard about the biopsies in 3 weeks.    SIGNATURES/CONFIDENTIALITY: You and/or your care partner have signed paperwork which will be entered into your electronic medical record.  These signatures attest to the fact that that the information above on your After  Visit Summary has been reviewed and is understood.  Full responsibility of the confidentiality of this discharge information lies with you and/or your care-partner.

## 2020-05-05 NOTE — Progress Notes (Signed)
1458 HR dropped to 39 to 49 with BP dropping 75/50.  Robinul 0.2 mg IV given and ephedrine 10 mg given.  VSS returned to baseline.

## 2020-05-05 NOTE — Progress Notes (Signed)
Report given to PACU, vss 

## 2020-05-05 NOTE — Progress Notes (Signed)
Medical history reviewed with no changes noted. VS assessed by J.D 

## 2020-05-05 NOTE — Op Note (Addendum)
[place] Patient Name: Bill Lee Procedure Date: 05/05/2020 2:42 PM MRN: [id number] Endoscopist: Thornton Park MD, MD Age: 59 Referring MD:  Date of Birth: 06-15-1960 Gender: Male Account #: [id number] Procedure:                Colonoscopy Indications:              Intermittent rectal bleeding                           Prior colonoscopy with Dr. Maurene Capes [facility]) 2013 Medicines:                Monitored Anesthesia Care Procedure:                Pre-Anesthesia Assessment:                           - Prior to the procedure, a History and Physical                            was performed, and patient medications and                            allergies were reviewed. The patient's tolerance of                            previous anesthesia was also reviewed. The risks                            and benefits of the procedure and the sedation                            options and risks were discussed with the patient.                            All questions were answered, and informed consent                            was obtained. Prior Anticoagulants: The patient has                            taken no previous anticoagulant or antiplatelet                            agents. ASA Grade Assessment: I - A normal, healthy                            patient. After reviewing the risks and benefits,                            the patient was deemed in satisfactory condition to                            undergo the procedure.  After obtaining informed consent, the colonoscope                            was passed under direct vision. Throughout the                            procedure, the patient's blood pressure, pulse, and                            oxygen saturations were monitored continuously. The                            Colonoscope was introduced through the anus and                            advanced to the 3 cm into the ileum. A second                             forward view of the right colon was performed. The                            colonoscopy was performed without difficulty. The                            patient tolerated the procedure fairly well. The                            quality of the bowel preparation was good. The                            terminal ileum, ileocecal valve, appendiceal                            orifice, and rectum were photographed. Scope In: 2:57:06 PM Scope Out: 3:12:06 PM Scope Withdrawal Time: 0 hours 10 minutes 47 seconds  Total Procedure Duration: 0 hours 15 minutes 0 seconds  Findings:                 Non-bleeding external and internal hemorrhoids were                            found.                           A less than 1 mm possible polyp was found in the                            cecum. The polyp was flat. The polyp was removed                            with a cold biopsy forceps. Resection and retrieval                            were complete. Estimated blood loss  was minimal.                           The exam was otherwise without abnormality on                            direct and retroflexion views. Complications:            Temporary bradycardia that resolved with Robinol.                            Estimated blood loss: Minimal. Estimated Blood Loss:     Estimated blood loss was minimal. Impression:               - Non-bleeding external and internal hemorrhoids.                            The likely cause of rectal bleeding.                           - One less than 1 mm possible polyp in the cecum,                            removed with a cold biopsy forceps. Resected and                            retrieved.                           - The examination was otherwise normal on direct                            and retroflexion views. Recommendation:           - Patient has a contact number available for                            emergencies. The signs and  symptoms of potential                            delayed complications were discussed with the                            patient. Return to normal activities tomorrow.                            Written discharge instructions were provided to the                            patient.                           - Resume previous diet. High fiber diet recommended.                           - Continue present medications. Add a daily stool  bulking agent such as psyllium or methylcellulose.                           - Await pathology results.                           - Repeat colonoscopy date to be determined after                            pending pathology results are reviewed for                            surveillance.                           - Emerging evidence supports eating a diet of                            fruits, vegetables, grains, calcium, and yogurt                            while reducing red meat and alcohol may reduce the                            risk of colon cancer.                           - Thank you for allowing me to be involved in your                            colon cancer prevention. Thornton Park MD, MD 05/05/2020 3:26:46 PM This report has been signed electronically.

## 2020-05-05 NOTE — Progress Notes (Signed)
43 Pt experienced laryngeal spasm with Sats down to 50s with PPV performed with PEEP valve and nasal tube 7.0 placed without trauma.  O2Sat returned to >90% on 100% FI O2. vss

## 2020-05-05 NOTE — Progress Notes (Signed)
1440 Robinul 0.1 mg IV given due large amount of secretions upon assessment.  MD made aware, vss  

## 2020-05-05 NOTE — Op Note (Addendum)
[name] Patient Name: Bill Lee Procedure Date: 05/05/2020 2:42 PM MRN: [id number] Endoscopist: Thornton Park MD, MD Age: 59 Referring MD:  Date of Birth: 1960-06-05 Gender: Male Account #: [id number] Procedure:                Upper GI endoscopy Indications:              Dysphagia to solids                           Rare Meloxicam use for arthritis Medicines:                Monitored Anesthesia Care Procedure:                Pre-Anesthesia Assessment:                           - Prior to the procedure, a History and Physical                            was performed, and patient medications and                            allergies were reviewed. The patient's tolerance of                            previous anesthesia was also reviewed. The risks                            and benefits of the procedure and the sedation                            options and risks were discussed with the patient.                            All questions were answered, and informed consent                            was obtained. Prior Anticoagulants: The patient has                            taken no previous anticoagulant or antiplatelet                            agents. ASA Grade Assessment: I - A normal, healthy                            patient. After reviewing the risks and benefits,                            the patient was deemed in satisfactory condition to                            undergo the procedure.  After obtaining informed consent, the endoscope was                            passed under direct vision. Throughout the                            procedure, the patient's blood pressure, pulse, and                            oxygen saturations were monitored continuously. The                            Endoscope was introduced through the mouth, and                            advanced to the third part of duodenum. The upper                             GI endoscopy was accomplished without difficulty.                            The patient tolerated the procedure well. Scope In: Scope Out: Findings:                 LA Grade B (one or more mucosal breaks greater than                            5 mm, not extending between the tops of two mucosal                            folds) esophagitis with no bleeding was found.                            There is an associated widely-patent stricture.                            Biopsies were taken from the mid/proximal and                            distal esophagus with a cold forceps for histology                            and in an effort to disrupt the stricture.                            Estimated blood loss was minimal.                           Patchy mildly erythematous mucosa without bleeding                            was found in the gastric body. Biopsies were taken  from the antrum, body, and fundus with a cold                            forceps for histology. Estimated blood loss was                            minimal.                           Patchy mildly erythematous mucosa without active                            bleeding and with no stigmata of bleeding was found                            in the duodenal bulb. Biopsies were taken with a                            cold forceps for histology. Estimated blood loss                            was minimal.                           The cardia and gastric fundus were normal on                            retroflexion.                           The exam was otherwise without abnormality. Complications:            Temporary hypoxemia that resolved with                            repositioning of the airway. Estimated blood loss:                            Minimal. Estimated Blood Loss:     Estimated blood loss was minimal. Impression:               - LA Grade B reflux esophagitis with no bleeding                             with non-obstructing reflux-related stricture.                            Biopsied. Not dilated due to ongoing inflammation.                           - Erythematous mucosa in the gastric body. Biopsied.                           - Erythematous duodenopathy. Biopsied.                           -  The examination was otherwise normal. Recommendation:           - Patient has a contact number available for                            emergencies. The signs and symptoms of potential                            delayed complications were discussed with the                            patient. Return to normal activities tomorrow.                            Written discharge instructions were provided to the                            patient.                           - Resume previous diet.                           - Continue present medications.                           - Start pantoprazole 40 mg po BID x 8 weeks. Then                            reduce to 40 mg QAM.                           - No aspirin, ibuprofen, naproxen, or other                            non-steroidal anti-inflammatory drugs including                            meloxicam.                           - Await pathology results. Thornton Park MD, MD 05/05/2020 3:22:13 PM This report has been signed electronically.

## 2020-05-07 ENCOUNTER — Telehealth: Payer: Self-pay

## 2020-05-07 ENCOUNTER — Telehealth: Payer: Self-pay | Admitting: *Deleted

## 2020-05-07 NOTE — Telephone Encounter (Signed)
  Follow up Call-  Call back number 05/05/2020  Post procedure Call Back phone  # [phone]  Permission to leave phone message Yes  Some recent data might be hidden     No answer, no VM.  Unable to leave message.

## 2020-05-07 NOTE — Telephone Encounter (Signed)
No answer, left message to call back later today, B.Itzae Miralles RN. 

## 2020-06-03 ENCOUNTER — Telehealth: Payer: Self-pay | Admitting: Gastroenterology

## 2020-06-03 NOTE — Telephone Encounter (Signed)
Bill Lee with Dr. Ival Bible office called requesting a path results letter indicating when pt is due for a repeat coon. She stated that Dr. Ernie Hew would like something in writing. Pls fax it to [phone].

## 2020-06-03 NOTE — Telephone Encounter (Signed)
Result note sent to pts PCP with repeat colonoscopy recommendations.

## 2020-07-17 ENCOUNTER — Ambulatory Visit: Payer: 59 | Admitting: Gastroenterology

## 2020-07-22 ENCOUNTER — Encounter: Payer: Self-pay | Admitting: Gastroenterology

## 2020-07-22 ENCOUNTER — Ambulatory Visit (INDEPENDENT_AMBULATORY_CARE_PROVIDER_SITE_OTHER): Payer: 59 | Admitting: Gastroenterology

## 2020-07-22 ENCOUNTER — Other Ambulatory Visit: Payer: Self-pay

## 2020-07-22 VITALS — BP 138/94 | HR 70 | Ht 67.0 in | Wt 211.0 lb

## 2020-07-22 DIAGNOSIS — K209 Esophagitis, unspecified without bleeding: Secondary | ICD-10-CM

## 2020-07-22 DIAGNOSIS — R1310 Dysphagia, unspecified: Secondary | ICD-10-CM | POA: Diagnosis not present

## 2020-07-22 MED ORDER — PANTOPRAZOLE SODIUM 40 MG PO TBEC: 40.0000 mg | DELAYED_RELEASE_TABLET | Freq: Every morning | ORAL | 0 refills | Status: DC

## 2020-07-22 NOTE — Patient Instructions (Addendum)
It was a pleasure to see you today. Based on our discussion, I am providing you with my recommendations below:  RECOMMENDATION(S):   I recommend that you continue on your pantoprazole 40 mg every morning for 4 additional weeks. Then, reduce to pantoprazole 40 mg every other day for at least 10 days. If you are still feeling well, reduce the dose to 40 mg every 3rd day. After 10 days, you may stop the medication all together. If at any point, you are feeling more difficulty swallowing or that food is getting stuff or heartburn, go back up to the higher dose of therapy.    PRESCRIPTION MEDICATION(S):   We have sent the following medication(s) to your pharmacy:  . Pantoprazole - please taper as written above. We have only sent a 90 day supply without refills  NOTE: If your medication(s) requires a PRIOR AUTHORIZATION, we will receive notification from your pharmacy. Once received, the process to submit for approval may take up to 7-10 business days. You will be contacted about any denials we have received from your insurance company as well as alternatives recommended by your provider.  BMI:  . If you are age 35 or younger, your body mass index should be between 19-25. Your There is no height or weight on file to calculate BMI. If this is out of the aformentioned range listed, please consider follow up with your Primary Care Provider.   Thank you for trusting me with your gastrointestinal care!    Thornton Park, MD, MPH

## 2020-07-22 NOTE — Progress Notes (Signed)
Referring Provider: Fanny Bien, MD Primary Care Physician:  Bill Bien, MD  Chief complaint:  Dysphagia, rectal bleeding   IMPRESSION/PLAN:  LA Class B reflux esophagitis with associated dysphagia to solids Intraepithelial lymphocytes and mild villous blunting on duodenal biopsies    - likely due to NSAIDs    - consider serologies for sprue with any symptoms Rare Meloxicam use for arthritis Intermittent rectal bleeding due to hemorrhoids Tubular adenoma on colonoscopy 2021 Prior colonoscopy with Dr. Maurene Capes [facility]) 2013  LA Class B reflux esophagitis: Continue pantoprazole 40 mg QAM for another 4 weeks follow by a slow taper. Reviewed lifestyle modifications.  Abnormal duodenal biopsies: Likely due to NSAIDs. Proceed with additional evaluation with new symptoms.   Intermittent rectal bleeding: Medical therapy of hemorrhoids recommended.   Tubular adenoma: Surveillance colonoscopy in 7 years, earlier with new symptoms.   Please see the "Patient Instructions" section for addition details about the plan.  HPI: Bill Lee is a 60 y.o. male who returns in follow-up after endoscopic evaluation.  The history is obtained through the patient.  He has arthritis managed with meloxicam and physical therapy.  At the time of his consultation 10/21 he reported a 2 year history of intermittent dysphagia localized to the sternal notch. Sensation that food is stuck, particularly when eating dry foods. Improved if he tucks his chin when he swallows.   No alarm features.  His wife didn't want him to start heartburn medicine that he may have to be on the indefinately.  He also reported intermittent rectal bleeding on the toilet paper without constipation, diarrhea, change in bowel habits, or straining. No rectal pain.  Uses Metamucil daily with control of daily bowel habits.  Has at least 1 formed bowel movement daily.  Had a colonoscopy in 2013 with Dr. Maurene Capes in [place], [place]. Colonoscopy  05/05/20 showed internal and external hemorrhoids and a small cecal tubular adenoma EGD12/13/21 showed LA Grade B reflux esophagitis, gastritis, and duodenitis. Duodenal biopsies showed focal increased intraepithelial lymphocytes and mild villous blunting.   Has reduced his pantoprazole 40 mg QAM. He would like to stop it all together given concerns for long term risks.  No further dysphagia. Continues to use Mobic.  No rectal bleeding following his colonoscopy.     Past Medical History:  Diagnosis Date  . Allergy   . Arthritis     Past Surgical History:  Procedure Laterality Date  . APPENDECTOMY    . COLONOSCOPY  2013  . KNEE ARTHROSCOPY Left   . LIPOMA EXCISION    . PROSTATE BIOPSY    . TONSILLECTOMY    . VASECTOMY      Current Outpatient Medications  Medication Sig Dispense Refill  . DULoxetine (CYMBALTA) 20 MG capsule Take 20 mg by mouth daily.    . meloxicam (MOBIC) 7.5 MG tablet Take 7.5 mg by mouth as needed.     . pantoprazole (PROTONIX) 40 MG tablet Take 1 tablet (40 mg total) by mouth 2 (two) times daily. (Patient taking differently: Take 40 mg by mouth daily.) 60 tablet 2  . psyllium (REGULOID) 0.52 g capsule Take 0.52 g by mouth daily. Takes 1-2 tablets qd but may take up to 4 tablets qd     No current facility-administered medications for this visit.    Allergies as of 07/22/2020 - Review Complete 07/22/2020  Allergen Reaction Noted  . Penicillins  06/28/2018    Family History  Problem Relation Age of Onset  . Stroke Mother   .  Alcoholism Father   . Healthy Sister   . Dysphagia Maternal Grandmother   . Other Paternal Grandfather        pacemaker  . Colon cancer Neg Hx   . Stomach cancer Neg Hx   . Liver disease Neg Hx   . Esophageal cancer Neg Hx   . Pancreatic cancer Neg Hx     Social History   Socioeconomic History  . Marital status: Married    Spouse name: Not on file  . Number of children: Not on file  . Years of education: Not on file  .  Highest education level: Not on file  Occupational History  . Not on file  Tobacco Use  . Smoking status: Never Smoker  . Smokeless tobacco: Never Used  Vaping Use  . Vaping Use: Never used  Substance and Sexual Activity  . Alcohol use: Yes    Alcohol/week: 2.0 - 3.0 standard drinks    Types: 2 - 3 Cans of beer per week  . Drug use: Never  . Sexual activity: Yes    Partners: Female    Birth control/protection: None    Comment: married  Other Topics Concern  . Not on file  Social History Narrative  . Not on file   Social Determinants of Health   Financial Resource Strain: Not on file  Food Insecurity: Not on file  Transportation Needs: Not on file  Physical Activity: Not on file  Stress: Not on file  Social Connections: Not on file  Intimate Partner Violence: Not on file    Review of Systems: 12 system ROS is negative except as noted above with the addition of arthritis, back pain, confusion.   Physical Exam: General:   Alert,  well-nourished, pleasant and cooperative in NAD Head:  Normocephalic and atraumatic. Eyes:  Sclera clear, no icterus.   Conjunctiva pink. Ears:  Normal auditory acuity. Nose:  No deformity, discharge,  or lesions. Mouth:  No deformity or lesions.   Neck:  Supple; no masses or thyromegaly. Lungs:  Clear throughout to auscultation.   No wheezes. Heart:  Regular rate and rhythm; no murmurs. Abdomen:  Soft, nontender, nondistended, normal bowel sounds, no rebound or guarding. No hepatosplenomegaly.   Rectal:  Deferred  Msk:  Symmetrical. No boney deformities LAD: No inguinal or umbilical LAD Extremities:  No clubbing or edema. Neurologic:  Alert and  oriented x4;  grossly nonfocal Skin:  Intact without significant lesions or rashes. Psych:  Alert and cooperative. Normal mood and affect.    Bill Leon L. Tarri Glenn, MD, MPH 07/22/2020, 10:43 AM

## 2021-09-23 IMAGING — MR MR PROSTATE WO/W CM
11 series · 48 of 48 positions shown · IV contrast (multihance)
Comparison: None.

CLINICAL DATA: Prostatomegaly with elevated PSA level.

EXAM:
MR PROSTATE WITHOUT AND WITH CONTRAST
TECHNIQUE: Multiplanar multisequence MRI images were obtained of the pelvis
centered about the prostate. Pre and post contrast images were
obtained.
CONTRAST:  20mL MULTIHANCE GADOBENATE DIMEGLUMINE 529 MG/ML IV SOLN

[Series 3: T2 · coronal · 3.0mm · 0.56mm/px · 2 of 27 slices shown (1 of 3)]
[im 1/27]
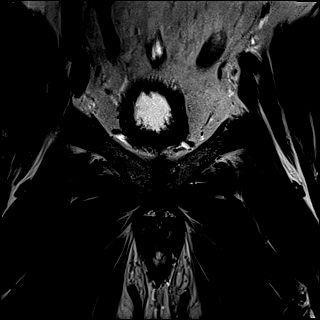
[im 27/27]
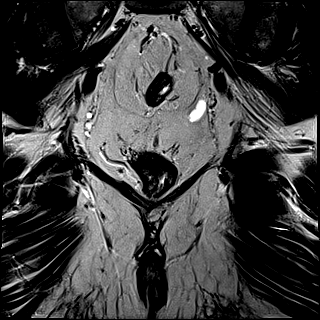

[Series 4: T1 · axial · 5.0mm · 1.25mm/px · z∈[-84,+111]mm · 3 of 80 slices shown]
[im 1/80]
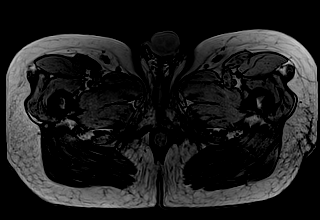
[im 40/80]
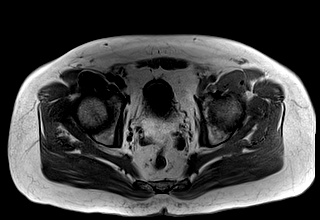
[im 80/80]
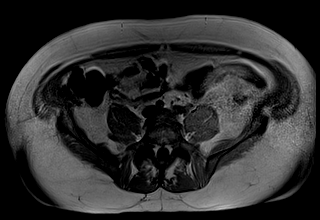

[Series 5: DWI · axial · 3.0mm · 1.75mm/px · z∈[-66,+15]mm · 3 of 84 slices shown (1 of 3)]
[im 1/84]
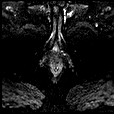
[im 42/84]
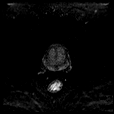
[im 84/84]
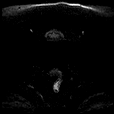

[Series 6: DWI · axial · 3.0mm · 1.75mm/px · 1 of 28 slices shown (2 of 3)]
[im 1/28]
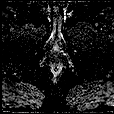

[Series 7: DWI · axial · 3.0mm · 1.75mm/px · 1 of 28 slices shown (3 of 3)]
[im 1/28]
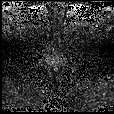

[Series 8: T2 · axial · 3.0mm · 0.56mm/px · 1 of 29 slices shown (2 of 3)]
[im 1/29]
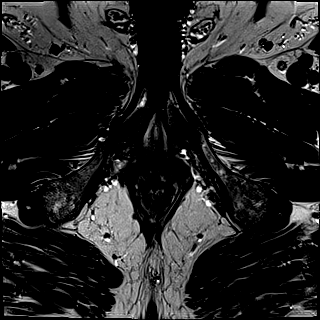

[Series 9: T2 · axial · 1.0mm · 1.04mm/px · z∈[-65,+14]mm · 3 of 80 slices shown (3 of 3)]
[im 1/80]
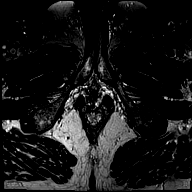
[im 40/80]
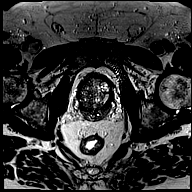
[im 80/80]
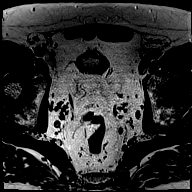

[Series 10: pre t1_twist_tra_dyn · axial · non-contrast · 3.5mm · 0.83mm/px · 1 of 24 slices shown]
[im 1/24]
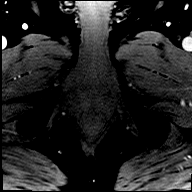

[Series 12: post t1_twist_tra_dyn-copy cent_sub · axial · 3.5mm · 0.83mm/px · z∈[-71,+9]mm · 27 of 696 slices shown]
[im 1/696]
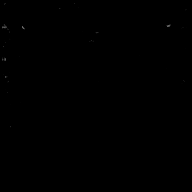
[im 27/696]
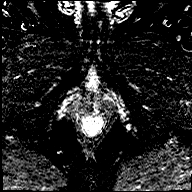
[im 54/696]
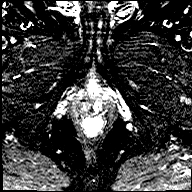
[im 81/696]
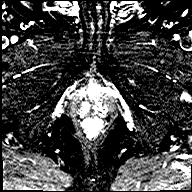
[im 107/696]
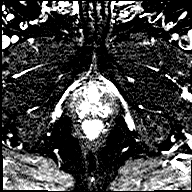
[im 134/696]
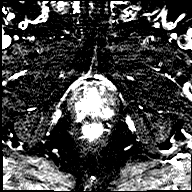
[im 161/696]
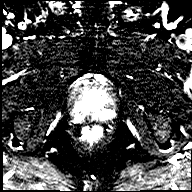
[im 188/696]
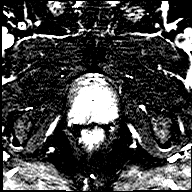
[im 214/696]
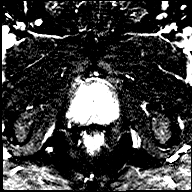
[im 241/696]
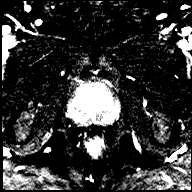
[im 268/696]
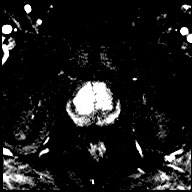
[im 295/696]
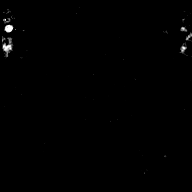
[im 321/696]
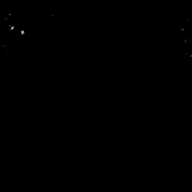
[im 348/696]
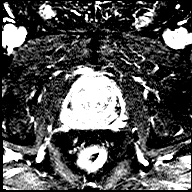
[im 375/696]
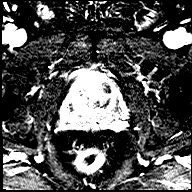
[im 401/696]
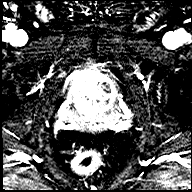
[im 428/696]
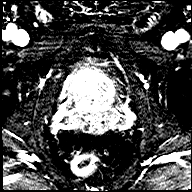
[im 455/696]
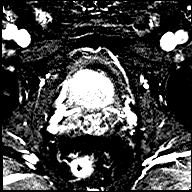
[im 482/696]
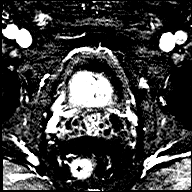
[im 508/696]
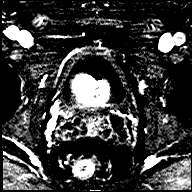
[im 535/696]
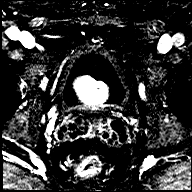
[im 562/696]
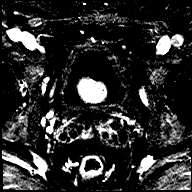
[im 589/696]
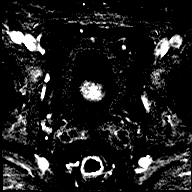
[im 615/696]
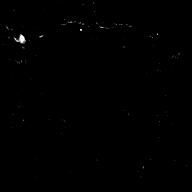
[im 642/696]
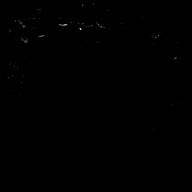
[im 669/696]
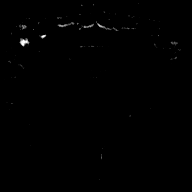
[im 696/696]
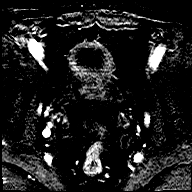

[Series 13: t1_vibe_dixon_tra_f · axial · 2.5mm · 0.91mm/px · z∈[-86,+111]mm · 3 of 80 slices shown]
[im 1/80]
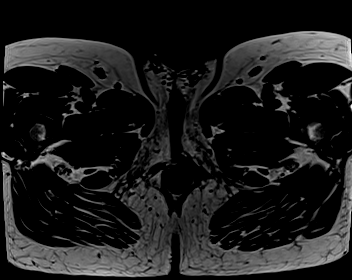
[im 40/80]
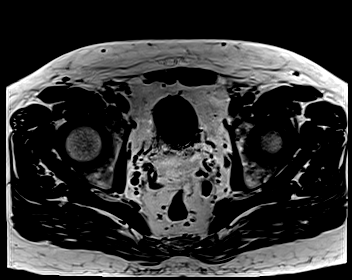
[im 80/80]
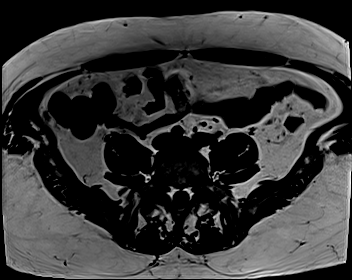

[Series 14: t1_vibe_dixon_tra_w · axial · 2.5mm · 0.91mm/px · z∈[-86,+111]mm · 3 of 80 slices shown]
[im 1/80]
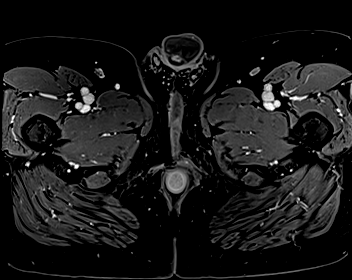
[im 40/80]
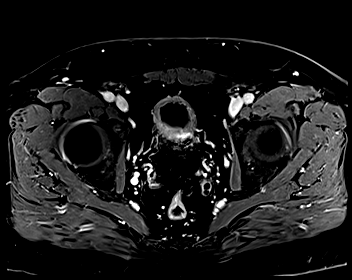
[im 80/80]
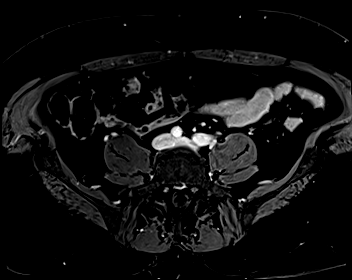

[48 of 48 positions shown; findings below may reference images not displayed]

FINDINGS: Prostate: Bilateral hazy known focal stranding in the peripheral
zone of the prostate gland, without restriction of diffusion and
with associated nonfocal diffuse peripheral zone enhancement. This
is considered PI-RADS category 2 and in general is likely benign and
postinflammatory.

Encapsulated nodularity of the transition zone without accentuated
restriction of diffusion compatible with benign prostatic
hypertrophy. The prostate gland indents the bladder base.

Volume: 3D volumetric analysis: Prostate volume 53.35 cubic cm (5.0
by 4.7 by 6.1 cm).

Transcapsular spread:  Absent

Seminal vesicle involvement: Absent

Neurovascular bundle involvement: Absent

Pelvic adenopathy: Absent

Bone metastasis: Absent

Other findings: No supplemental non-categorized findings.
IMPRESSION: 1. Benign prostatic hypertrophy. Prostate volume 53.35 cubic cm.
2. Bilateral hazy known focal stranding in the peripheral zone of
the prostate gland, without restriction of diffusion and with
associated nonfocal diffuse peripheral zone enhancement. This is
considered PI-RADS category 2 and in general is likely benign and
postinflammatory.

## 2022-01-08 ENCOUNTER — Other Ambulatory Visit: Payer: Self-pay | Admitting: Urology

## 2022-01-08 DIAGNOSIS — R972 Elevated prostate specific antigen [PSA]: Secondary | ICD-10-CM

## 2022-01-08 DIAGNOSIS — Z125 Encounter for screening for malignant neoplasm of prostate: Secondary | ICD-10-CM

## 2022-01-27 ENCOUNTER — Other Ambulatory Visit: Payer: 59

## 2022-02-08 ENCOUNTER — Ambulatory Visit
Admission: RE | Admit: 2022-02-08 | Discharge: 2022-02-08 | Disposition: A | Discharge status: Home / Self Care | Payer: 59 | Source: Ambulatory Visit | Attending: Urology | Admitting: Urology

## 2022-02-08 DIAGNOSIS — R972 Elevated prostate specific antigen [PSA]: Secondary | ICD-10-CM

## 2022-02-08 DIAGNOSIS — Z125 Encounter for screening for malignant neoplasm of prostate: Secondary | ICD-10-CM

## 2022-02-08 MED ORDER — GADOBENATE DIMEGLUMINE 529 MG/ML IV SOLN
20.0000 mL | Freq: Once | INTRAVENOUS | Status: AC | PRN
  Administered 2022-02-08: 20 mL via INTRAVENOUS

## 2022-06-03 NOTE — Progress Notes (Signed)
[facility] [place] Phone: [phone] Subjective:   Bill Lee, am serving as a scribe for Dr. Hulan Saas. I'm seeing this patient by the request  of:  Fanny Bien, MD  CC: Right shoulder pain  [id number]  Bill Lee is a 62 y.o. male coming in with complaint of R shoulder pain. Patient state that the right shoulder does not hurt all the time just during certain moves. Patient is unable to reproduce the pain on his own just knows that when it does happen it is a dull stabbing pain "like being stabbed with a butter knife." Pain has been going on a few months. Patient is newly married and does sleep with his arm under her pillow and knows that does cause pain but he doesn't feel like that is the reason why it started hurting.  Patient locates pain to the top of his right shoulder by the acromion process.       Past Medical History:  Diagnosis Date   Allergy    Arthritis    Past Surgical History:  Procedure Laterality Date   APPENDECTOMY     COLONOSCOPY  2013   KNEE ARTHROSCOPY Left    LIPOMA EXCISION     PROSTATE BIOPSY     TONSILLECTOMY     VASECTOMY     Social History   Socioeconomic History   Marital status: Married    Spouse name: Not on file   Number of children: Not on file   Years of education: Not on file   Highest education level: Not on file  Occupational History   Not on file  Tobacco Use   Smoking status: Never   Smokeless tobacco: Never  Vaping Use   Vaping Use: Never used  Substance and Sexual Activity   Alcohol use: Yes    Alcohol/week: 2.0 - 3.0 standard drinks of alcohol    Types: 2 - 3 Cans of beer per week   Drug use: Never   Sexual activity: Yes    Partners: Female    Birth control/protection: None    Comment: married  Other Topics Concern   Not on file  Social History Narrative   Not on file   Social Determinants of Health   Financial Resource Strain:  Not on file  Food Insecurity: Not on file  Transportation Needs: Not on file  Physical Activity: Not on file  Stress: Not on file  Social Connections: Not on file   Allergies  Allergen Reactions   Penicillins    Family History  Problem Relation Age of Onset   Stroke Mother    Alcoholism Father    Healthy Sister    Dysphagia Maternal Grandmother    Other Paternal Grandfather        pacemaker   Colon cancer Neg Hx    Stomach cancer Neg Hx    Liver disease Neg Hx    Esophageal cancer Neg Hx    Pancreatic cancer Neg Hx          Current Outpatient Medications (Other):    psyllium (REGULOID) 0.52 g capsule, Take 0.52 g by mouth daily. Takes 1-2 tablets qd but may take up to 4 tablets qd   Reviewed prior external information including notes and imaging from  primary care provider As well as notes that were available from care everywhere and other healthcare systems.  Past medical history, social, surgical and family history all reviewed in electronic  medical record.  No pertanent information unless stated regarding to the chief complaint.   Review of Systems:  No headache, visual changes, nausea, vomiting, diarrhea, constipation, dizziness, abdominal pain, skin rash, fevers, chills, night sweats, weight loss, swollen lymph nodes, body aches, joint swelling, chest pain, shortness of breath, mood changes. POSITIVE muscle aches  Objective  Blood pressure 124/84, pulse 82, height '5\' 7"'$  (1.702 m), weight 221 lb (100.2 kg), SpO2 96 %.   General: No apparent distress alert and oriented x3 mood and affect normal, dressed appropriately.  HEENT: Pupils equal, extraocular movements intact  Respiratory: Patient's speak in full sentences and does not appear short of breath  Cardiovascular: No lower extremity edema, non tender, no erythema  Shoulder: Right Inspection reveals no abnormalities, atrophy or asymmetry. Palpation is normal with no tenderness over AC joint or bicipital  groove. ROM is full in all planes passively. Rotator cuff strength normal throughout. signs of impingement with positive Neer and Hawkin's tests, but negative empty can sign. Speeds and Yergason's tests normal. No labral pathology noted with negative Obrien's, negative clunk and good stability. Normal scapular function observed. No painful arc and no drop arm sign. No apprehension sign  MSK US performed of: Right This study was ordered, performed, and interpreted by Charlann Boxer D.O.  Shoulder:   Supraspinatus: Hypoechoic changes that is consistent with scar tissue formation at the insertion on the humeral head.  Patient does have still hypoechoic changes just proximal to this area that could be a still a small likely low-grade tear of the supraspinatus. Infraspinatus:  Appears normal on long and transverse views. Significant increase in Doppler flow Subscapularis:  Appears normal on long and transverse views. Positive bursa AC joint: Arthritic changes with capsular distention Glenohumeral Joint:  Appears normal without effusion. Glenoid Labrum:  Intact without visualized tears. Biceps Tendon:  Appears normal on long and transverse views, no fraying of tendon, tendon located in intertubercular groove, no subluxation with shoulder internal or external rotation.  Impression: Subacromial bursitis, partial tear of the supraspinatus  97110; 15 additional minutes spent for Therapeutic exercises as stated in above notes.  This included exercises focusing on stretching, strengthening, with significant focus on eccentric aspects.   Long term goals include an improvement in range of motion, strength, endurance as well as avoiding reinjury. Patient's frequency would include in 1-2 times a day, 3-5 times a week for a duration of 6-12 weeks. Shoulder Exercises that included:  Basic scapular stabilization to include adduction and depression of scapula Scaption, focusing on proper movement and good  control Internal and External rotation utilizing a theraband, with elbow tucked at side entire time Rows with theraband    Proper technique shown and discussed handout in great detail with ATC.  All questions were discussed and answered.      Impression and Recommendations:     The above documentation has been reviewed and is accurate and complete Lyndal Pulley, DO

## 2022-06-09 ENCOUNTER — Ambulatory Visit: Payer: Self-pay

## 2022-06-09 ENCOUNTER — Ambulatory Visit (INDEPENDENT_AMBULATORY_CARE_PROVIDER_SITE_OTHER): Payer: 59 | Admitting: Family Medicine

## 2022-06-09 ENCOUNTER — Ambulatory Visit (INDEPENDENT_AMBULATORY_CARE_PROVIDER_SITE_OTHER): Payer: 59

## 2022-06-09 VITALS — BP 124/84 | HR 82 | Ht 67.0 in | Wt 221.0 lb

## 2022-06-09 DIAGNOSIS — M75111 Incomplete rotator cuff tear or rupture of right shoulder, not specified as traumatic: Secondary | ICD-10-CM

## 2022-06-09 DIAGNOSIS — M25511 Pain in right shoulder: Secondary | ICD-10-CM

## 2022-06-09 DIAGNOSIS — M75101 Unspecified rotator cuff tear or rupture of right shoulder, not specified as traumatic: Secondary | ICD-10-CM | POA: Insufficient documentation

## 2022-06-09 NOTE — Assessment & Plan Note (Signed)
Patient has an incomplete what appears to be a partial tear noted of the supraspinatus.  An underlying acromioclavicular arthritis could also be contributing to some of the discomfort patient is having.  Discussed which activities to do and which ones to avoid.  Discussed which activities to do that would also cause an aggravation.  Patient work with Product/process development scientist to learn home exercises and patient wants to start with topical anti-inflammatories.  Worsening pain can consider possible injections or formal physical therapy.  Follow-up again in 6 to 8 weeks.

## 2022-06-09 NOTE — Patient Instructions (Signed)
RTC exercises Keep hands in peripheral vision Xray on the way out Ice for 20 min 2x a day  Voltaren gel  See me again in 6 weeks

## 2022-07-19 NOTE — Progress Notes (Unsigned)
[facility] [place] Phone: [phone] Subjective:   Fontaine No, am serving as a scribe for Dr. Hulan Saas.  I'm seeing this patient by the request  of:  Fanny Bien, MD  CC: Right shoulder pain  [id number]  06/09/2022 Patient has an incomplete what appears to be a partial tear noted of the supraspinatus. An underlying acromioclavicular arthritis could also be contributing to some of the discomfort patient is having. Discussed which activities to do and which ones to avoid. Discussed which activities to do that would also cause an aggravation. Patient work with Product/process development scientist to learn home exercises and patient wants to start with topical anti-inflammatories. Worsening pain can consider possible injections or formal physical therapy. Follow-up again in 6 to 8 weeks.   Update 07/21/2022 Kenyen Ceron is a 62 y.o. male coming in with complaint of R shoulder pain. Patient states that he is improving but still has pain with certain IR and ER. Pain over superior and anterior aspect.       Past Medical History:  Diagnosis Date   Allergy    Arthritis    Past Surgical History:  Procedure Laterality Date   APPENDECTOMY     COLONOSCOPY  2013   KNEE ARTHROSCOPY Left    LIPOMA EXCISION     PROSTATE BIOPSY     TONSILLECTOMY     VASECTOMY     Social History   Socioeconomic History   Marital status: Married    Spouse name: Not on file   Number of children: Not on file   Years of education: Not on file   Highest education level: Not on file  Occupational History   Not on file  Tobacco Use   Smoking status: Never   Smokeless tobacco: Never  Vaping Use   Vaping Use: Never used  Substance and Sexual Activity   Alcohol use: Yes    Alcohol/week: 2.0 - 3.0 standard drinks of alcohol    Types: 2 - 3 Cans of beer per week   Drug use: Never   Sexual activity: Yes    Partners: Female    Birth  control/protection: None    Comment: married  Other Topics Concern   Not on file  Social History Narrative   Not on file   Social Determinants of Health   Financial Resource Strain: Not on file  Food Insecurity: Not on file  Transportation Needs: Not on file  Physical Activity: Not on file  Stress: Not on file  Social Connections: Not on file   Allergies  Allergen Reactions   Penicillins    Family History  Problem Relation Age of Onset   Stroke Mother    Alcoholism Father    Healthy Sister    Dysphagia Maternal Grandmother    Other Paternal Grandfather        pacemaker   Colon cancer Neg Hx    Stomach cancer Neg Hx    Liver disease Neg Hx    Esophageal cancer Neg Hx    Pancreatic cancer Neg Hx          Current Outpatient Medications (Other):    psyllium (REGULOID) 0.52 g capsule, Take 0.52 g by mouth daily. Takes 1-2 tablets qd but may take up to 4 tablets qd   Reviewed prior external information including notes and imaging from  primary care provider As well as notes that were available from care everywhere and other healthcare systems.  Past medical history, social, surgical and family history all reviewed in electronic medical record.  No pertanent information unless stated regarding to the chief complaint.   Review of Systems:  No headache, visual changes, nausea, vomiting, diarrhea, constipation, dizziness, abdominal pain, skin rash, fevers, chills, night sweats, weight loss, swollen lymph nodes, body aches, joint swelling, chest pain, shortness of breath, mood changes. POSITIVE muscle aches  Objective  Blood pressure (!) 110/92, pulse 82, height '5\' 7"'$  (1.702 m), weight 224 lb (101.6 kg), SpO2 93 %.   General: No apparent distress alert and oriented x3 mood and affect normal, dressed appropriately.  HEENT: Pupils equal, extraocular movements intact  Respiratory: Patient's speak in full sentences and does not appear short of breath  Cardiovascular: No  lower extremity edema, non tender, no erythema  Right shoulder exam still has hesitation with 4 out of 5 strength of the rotator cuff, mild positive voluntary guarding noted with Neer and Hawkins test as well.  Mild positive crossover noted.  Limited muscular skeletal ultrasound was performed and interpreted by Hulan Saas, M  Limited ultrasound of patient's shoulder shows the patient still has some intersubstance tearing noted no significant retraction noted, patient does have significant decrease in the hypoechoic changes that was noted previously. Impression: Intrasubstance tearing noted with very mild improvement    Impression and Recommendations:    The above documentation has been reviewed and is accurate and complete Lyndal Pulley, DO

## 2022-07-21 ENCOUNTER — Ambulatory Visit: Payer: Self-pay

## 2022-07-21 ENCOUNTER — Encounter: Payer: Self-pay | Admitting: Family Medicine

## 2022-07-21 ENCOUNTER — Ambulatory Visit (INDEPENDENT_AMBULATORY_CARE_PROVIDER_SITE_OTHER): Payer: 59 | Admitting: Family Medicine

## 2022-07-21 VITALS — BP 110/92 | HR 82 | Ht 67.0 in | Wt 224.0 lb

## 2022-07-21 DIAGNOSIS — M25511 Pain in right shoulder: Secondary | ICD-10-CM | POA: Diagnosis not present

## 2022-07-21 DIAGNOSIS — M75111 Incomplete rotator cuff tear or rupture of right shoulder, not specified as traumatic: Secondary | ICD-10-CM

## 2022-07-21 NOTE — Assessment & Plan Note (Signed)
Patient has made significant strides but still has unfortunately tearing noted.  Discussed different treatment options.  Patient would like to do home exercises and icing regimen.  Patient will start with formal physical therapy with not having as much improvement noted.  Discussed which activities to do and which ones to avoid.  Follow-up with me again in 6 to 8 weeks

## 2022-07-21 NOTE — Patient Instructions (Signed)
You should do well overall  PT will be calling you  Ice and voltaren after a lot of activity  OK to hike and bike See me again in 7-8 weeks to make sure good healing

## 2022-09-08 NOTE — Progress Notes (Unsigned)
[facility] [address] [place] [address] Phone: [phone] Subjective:   Bill [facility], am serving as a scribe for Dr. Antoine Primas.  I'm seeing this patient by the request  of:  Lewis Moccasin, MD  CC: Right shoulder pain follow-up  [id number]  07/21/2022 Patient has made significant strides but still has unfortunately tearing noted.  Discussed different treatment options.  Patient would like to do home exercises and icing regimen.  Patient will start with formal physical therapy with not having as much improvement noted.  Discussed which activities to do and which ones to avoid.  Follow-up with me again in 6 to 8 week    Update 09/09/2022 Bill Lee is a 62 y.o. male coming in with complaint of R RTC tear. Patient states that he is improving but has pain with ER and flexion. Has not been doing exercises.  Would state that he is still 75% better than where he started.      Past Medical History:  Diagnosis Date   Allergy    Arthritis    Past Surgical History:  Procedure Laterality Date   APPENDECTOMY     COLONOSCOPY  2013   KNEE ARTHROSCOPY Left    LIPOMA EXCISION     PROSTATE BIOPSY     TONSILLECTOMY     VASECTOMY     Social History   Socioeconomic History   Marital status: Married    Spouse name: Not on file   Number of children: Not on file   Years of education: Not on file   Highest education level: Not on file  Occupational History   Not on file  Tobacco Use   Smoking status: Never   Smokeless tobacco: Never  Vaping Use   Vaping Use: Never used  Substance and Sexual Activity   Alcohol use: Yes    Alcohol/week: 2.0 - 3.0 standard drinks of alcohol    Types: 2 - 3 Cans of beer per week   Drug use: Never   Sexual activity: Yes    Partners: Female    Birth control/protection: None    Comment: married  Other Topics Concern   Not on file  Social History Narrative   Not on file   Social Determinants  of Health   Financial Resource Strain: Not on file  Food Insecurity: Not on file  Transportation Needs: Not on file  Physical Activity: Not on file  Stress: Not on file  Social Connections: Not on file   Allergies  Allergen Reactions   Penicillins    Family History  Problem Relation Age of Onset   Stroke Mother    Alcoholism Father    Healthy Sister    Dysphagia Maternal Grandmother    Other Paternal Grandfather        pacemaker   Colon cancer Neg Hx    Stomach cancer Neg Hx    Liver disease Neg Hx    Esophageal cancer Neg Hx    Pancreatic cancer Neg Hx          Current Outpatient Medications (Other):    psyllium (REGULOID) 0.52 g capsule, Take 0.52 g by mouth daily. Takes 1-2 tablets qd but may take up to 4 tablets qd   Reviewed prior external information including notes and imaging from  primary care provider As well as notes that were available from care everywhere and other healthcare systems.  Past medical history, social, surgical and family history all reviewed in electronic  medical record.  No pertanent information unless stated regarding to the chief complaint.   Review of Systems:  No headache, visual changes, nausea, vomiting, diarrhea, constipation, dizziness, abdominal pain, skin rash, fevers, chills, night sweats, weight loss, swollen lymph nodes, body aches, joint swelling, chest pain, shortness of breath, mood changes. POSITIVE muscle aches  Objective  Blood pressure 112/78, pulse 68, height  (1.702 m), weight 226 lb (102.5 kg), SpO2 98 %.   General: No apparent distress alert and oriented x3 mood and affect normal, dressed appropriately.  HEENT: Pupils equal, extraocular movements intact  Respiratory: Patient's speak in full sentences and does not appear short of breath  Cardiovascular: No lower extremity edema, non tender, no erythema  Right shoulder exam still shows some mild 4 out of 5 strength noted with internal and external range of  motion.  Rotator cuff strength on empty can seems to be intact.  Mild positive crossover noted.  Limited muscular skeletal ultrasound was performed and interpreted by Antoine Primas, M  Limited ultrasound shows the patient does have hypoechoic changes noted of the acromioclavicular joint.  Rotator cuff of the supraspinatus appears to have some increasing in fibers that is consistent with some potential healing. Impression: Interval improvement    Impression and Recommendations:    The above documentation has been reviewed and is accurate and complete Judi Saa, DO

## 2022-09-09 ENCOUNTER — Other Ambulatory Visit: Payer: Self-pay

## 2022-09-09 ENCOUNTER — Ambulatory Visit (INDEPENDENT_AMBULATORY_CARE_PROVIDER_SITE_OTHER): Payer: 59 | Admitting: Family Medicine

## 2022-09-09 ENCOUNTER — Encounter: Payer: Self-pay | Admitting: Family Medicine

## 2022-09-09 VITALS — BP 112/78 | HR 68 | Ht 67.0 in | Wt 226.0 lb

## 2022-09-09 DIAGNOSIS — M75111 Incomplete rotator cuff tear or rupture of right shoulder, not specified as traumatic: Secondary | ICD-10-CM

## 2022-09-09 DIAGNOSIS — M25511 Pain in right shoulder: Secondary | ICD-10-CM

## 2022-09-09 NOTE — Assessment & Plan Note (Signed)
Patient's right rotator cuff seems to be healing at this time.  Hopefully will continue to make some progress. Patient on exam external range of motion.  We discussed with patient that if worsening we do need to consider MRI at this point.  Patient wants to continue with the conservative therapy with him being noncompliant with some of the home exercises.  Follow-up with me again in 3 months

## 2022-09-09 NOTE — Patient Instructions (Signed)
Great to see you  Keep increasing activity  Ice after a lot of activity  One more check in in 3 months If worsening though call and we can consider MRI

## 2022-12-08 NOTE — Progress Notes (Signed)
[facility] [address] [place] [address] Phone: [phone] Subjective:   Bill Lee, am serving as a scribe for Dr. Antoine Primas.  I'm seeing this patient by the request  of:  Lewis Moccasin, MD  CC: shoulder pain follow up   [id number]  09/09/2022 Patient's right rotator cuff seems to be healing at this time.  Hopefully will continue to make some progress. Patient on exam external range of motion.  We discussed with patient that if worsening we do need to consider MRI at this point.  Patient wants to continue with the conservative therapy with him being noncompliant with some of the home exercises.  Follow-up with me again in 3 months      Update 12/09/2022 Kalobe Batman is a 62 y.o. male coming in with complaint of R shoulder pain. Patient states that he is doing well. Not having much pain except with flexion at certain points.      Past Medical History:  Diagnosis Date   Allergy    Arthritis    Past Surgical History:  Procedure Laterality Date   APPENDECTOMY     COLONOSCOPY  2013   KNEE ARTHROSCOPY Left    LIPOMA EXCISION     PROSTATE BIOPSY     TONSILLECTOMY     VASECTOMY     Social History   Socioeconomic History   Marital status: Married    Spouse name: Not on file   Number of children: Not on file   Years of education: Not on file   Highest education level: Not on file  Occupational History   Not on file  Tobacco Use   Smoking status: Never   Smokeless tobacco: Never  Vaping Use   Vaping status: Never Used  Substance and Sexual Activity   Alcohol use: Yes    Alcohol/week: 2.0 - 3.0 standard drinks of alcohol    Types: 2 - 3 Cans of beer per week   Drug use: Never   Sexual activity: Yes    Partners: Female    Birth control/protection: None    Comment: married  Other Topics Concern   Not on file  Social History Narrative   Not on file   Social Determinants of Health   Financial Resource  Strain: Not on file  Food Insecurity: Not on file  Transportation Needs: Not on file  Physical Activity: Not on file  Stress: Not on file  Social Connections: Not on file   Allergies  Allergen Reactions   Penicillins      Objective  Blood pressure 118/88, pulse 90, height 5\' 7"  (1.702 m), weight 224 lb (101.6 kg), SpO2 97%.   General: No apparent distress alert and oriented x3 mood and affect normal, dressed appropriately.  HEENT: Pupils equal, extraocular movements intact  Respiratory: Patient's speak in full sentences and does not appear short of breath  Cardiovascular: No lower extremity edema, non tender, no erythema  Shoulder exam shows patient does have improvement in range of motion at this time.  5 out of 5 strength noted.  Limited muscular skeletal ultrasound was performed and interpreted by Antoine Primas, M  Limited ultrasound shows that patient has only hypoechoic changes of the acromioclavicular joint with some moderate narrowing noted. Rotator cuff shows interval improvement again Impression: Interval improvement of the rotator cuff but continued arthritic changes of the acromioclavicular joint   Impression and Recommendations:     The above documentation has been reviewed and is accurate  and complete Judi Saa, DO

## 2022-12-09 ENCOUNTER — Other Ambulatory Visit: Payer: Self-pay

## 2022-12-09 ENCOUNTER — Ambulatory Visit: Payer: 59 | Admitting: Family Medicine

## 2022-12-09 ENCOUNTER — Encounter: Payer: Self-pay | Admitting: Family Medicine

## 2022-12-09 VITALS — BP 118/88 | HR 90 | Ht 67.0 in | Wt 224.0 lb

## 2022-12-09 DIAGNOSIS — M75111 Incomplete rotator cuff tear or rupture of right shoulder, not specified as traumatic: Secondary | ICD-10-CM

## 2022-12-09 DIAGNOSIS — M25511 Pain in right shoulder: Secondary | ICD-10-CM

## 2022-12-09 NOTE — Patient Instructions (Signed)
Good to see you   

## 2022-12-09 NOTE — Assessment & Plan Note (Signed)
Patient remarkably has healed relatively well of the rotator cuff tear.  Does still have the moderate arthritic changes noted of the acromioclavicular joint with small effusion noted.  We discussed with patient that we can always consider injection if needed.  Patient will continue to monitor certain range of motion exercises, which activities to do and which ones to avoid.  Follow-up again as needed
